# Patient Record
Sex: Female | Born: 1985 | Hispanic: No | Marital: Married | Smoking: Never smoker
Health system: Southern US, Community
[De-identification: ages and names within clinical notes are randomized; demographics above are authoritative.]

## PROBLEM LIST (undated history)

## (undated) DIAGNOSIS — O24419 Gestational diabetes mellitus in pregnancy, unspecified control: Secondary | ICD-10-CM

## (undated) DIAGNOSIS — O009 Unspecified ectopic pregnancy without intrauterine pregnancy: Secondary | ICD-10-CM

---

## 2018-05-09 LAB — OB RESULTS CONSOLE GC/CHLAMYDIA
Chlamydia: NEGATIVE
Gonorrhea: NEGATIVE

## 2018-05-09 LAB — OB RESULTS CONSOLE HIV ANTIBODY (ROUTINE TESTING): HIV: NONREACTIVE

## 2018-05-09 LAB — OB RESULTS CONSOLE HEPATITIS B SURFACE ANTIGEN: Hepatitis B Surface Ag: NEGATIVE

## 2018-05-09 LAB — OB RESULTS CONSOLE ANTIBODY SCREEN: ANTIBODY SCREEN: NEGATIVE

## 2018-05-09 LAB — OB RESULTS CONSOLE ABO/RH: RH TYPE: POSITIVE

## 2018-05-09 LAB — OB RESULTS CONSOLE RUBELLA ANTIBODY, IGM: Rubella: IMMUNE

## 2018-05-09 LAB — OB RESULTS CONSOLE RPR: RPR: NONREACTIVE

## 2018-05-13 ENCOUNTER — Other Ambulatory Visit: Payer: Self-pay

## 2018-05-13 ENCOUNTER — Encounter (HOSPITAL_BASED_OUTPATIENT_CLINIC_OR_DEPARTMENT_OTHER): Payer: Self-pay | Admitting: *Deleted

## 2018-05-13 ENCOUNTER — Emergency Department (HOSPITAL_BASED_OUTPATIENT_CLINIC_OR_DEPARTMENT_OTHER): Payer: PPO

## 2018-05-13 ENCOUNTER — Emergency Department (HOSPITAL_BASED_OUTPATIENT_CLINIC_OR_DEPARTMENT_OTHER)
Admission: EM | Admit: 2018-05-13 | Discharge: 2018-05-14 | Disposition: A | Discharge status: Home / Self Care | Payer: PPO | Attending: Emergency Medicine | Admitting: Emergency Medicine

## 2018-05-13 DIAGNOSIS — O200 Threatened abortion: Secondary | ICD-10-CM | POA: Insufficient documentation

## 2018-05-13 DIAGNOSIS — O209 Hemorrhage in early pregnancy, unspecified: Secondary | ICD-10-CM | POA: Diagnosis present

## 2018-05-13 DIAGNOSIS — N939 Abnormal uterine and vaginal bleeding, unspecified: Secondary | ICD-10-CM

## 2018-05-13 DIAGNOSIS — O468X1 Other antepartum hemorrhage, first trimester: Secondary | ICD-10-CM

## 2018-05-13 DIAGNOSIS — Z3A10 10 weeks gestation of pregnancy: Secondary | ICD-10-CM | POA: Insufficient documentation

## 2018-05-13 DIAGNOSIS — O418X10 Other specified disorders of amniotic fluid and membranes, first trimester, not applicable or unspecified: Secondary | ICD-10-CM

## 2018-05-13 LAB — CBC WITH DIFFERENTIAL/PLATELET
Basophils Absolute: 0 10*3/uL (ref 0.0–0.1)
Basophils Relative: 0 %
Eosinophils Absolute: 0.4 10*3/uL (ref 0.0–0.7)
Eosinophils Relative: 4 %
HCT: 34.8 % — ABNORMAL LOW (ref 36.0–46.0)
HEMOGLOBIN: 11.7 g/dL — AB (ref 12.0–15.0)
LYMPHS ABS: 2.9 10*3/uL (ref 0.7–4.0)
LYMPHS PCT: 29 %
MCH: 27.7 pg (ref 26.0–34.0)
MCHC: 33.6 g/dL (ref 30.0–36.0)
MCV: 82.3 fL (ref 78.0–100.0)
MONOS PCT: 10 %
Monocytes Absolute: 0.9 10*3/uL (ref 0.1–1.0)
NEUTROS ABS: 5.6 10*3/uL (ref 1.7–7.7)
NEUTROS PCT: 57 %
Platelets: 312 10*3/uL (ref 150–400)
RBC: 4.23 MIL/uL (ref 3.87–5.11)
RDW: 13.2 % (ref 11.5–15.5)
WBC: 9.8 10*3/uL (ref 4.0–10.5)

## 2018-05-13 LAB — BASIC METABOLIC PANEL
Anion gap: 8 (ref 5–15)
BUN: 8 mg/dL (ref 6–20)
CHLORIDE: 103 mmol/L (ref 98–111)
CO2: 25 mmol/L (ref 22–32)
CREATININE: 0.51 mg/dL (ref 0.44–1.00)
Calcium: 9 mg/dL (ref 8.9–10.3)
GFR calc non Af Amer: >60 mL/min (ref >60)
Glucose, Bld: 74 mg/dL (ref 70–99)
Potassium: 3.7 mmol/L (ref 3.5–5.1)
Sodium: 136 mmol/L (ref 135–145)

## 2018-05-13 LAB — ABO/RH: ABO/RH(D): O POS

## 2018-05-13 LAB — PREGNANCY, URINE: Preg Test, Ur: POSITIVE — AB

## 2018-05-13 LAB — HCG, QUANTITATIVE, PREGNANCY: hCG, Beta Chain, Quant, S: 176440 m[IU]/mL — ABNORMAL HIGH (ref <5)

## 2018-05-13 MED ORDER — ACETAMINOPHEN 500 MG PO TABS
1000.0000 mg | ORAL_TABLET | Freq: Once | ORAL | Status: AC
  Administered 2018-05-13: 1000 mg via ORAL
  Filled 2018-05-13: qty 2

## 2018-05-13 MED ORDER — SODIUM CHLORIDE 0.9 % IV BOLUS: 1000.0000 mL | Freq: Once | INTRAVENOUS | Status: DC

## 2018-05-13 NOTE — ED Triage Notes (Signed)
Pt states she is [redacted] weeks pregnant with twins. C/o vaginal bleeding "water with blood" x 30 mins

## 2018-05-13 NOTE — ED Notes (Signed)
Snacks given to family. Pt given pads for continued bleeding. No changes. Alert, NAD, calm.

## 2018-05-13 NOTE — ED Notes (Signed)
Remains in US.

## 2018-05-13 NOTE — ED Notes (Addendum)
Alert, NAD, calm, interactive, resps e/u, speaking in clear complete sentences, no dyspnea noted, skin W&D, VSS, pain worse, back and abd, 9/10, denies nausea. EDPA notified. [facility]

## 2018-05-13 NOTE — ED Notes (Addendum)
Alert, NAD, calm, interactive, resps e/u, speaking in clear complete sentences, no dyspnea noted, skin W&D, initial VSS, c/o mid lower abd pain and vaginal bleeding, (denies: other sx, including: other pain, urinary sx, sob, nausea, dizziness or visual changes), mentions "h/o ectopic and infertility tx", [facility]. Pt to US at this time.

## 2018-05-13 NOTE — ED Notes (Signed)
Pelvic in progress. VSS when lying supine. BP reading low when lying on L side.

## 2018-05-13 NOTE — ED Provider Notes (Signed)
[facility] EMERGENCY DEPARTMENT Provider Note   CSN: [id number] Arrival date & time: 05/13/18  2021     History   Chief Complaint Chief Complaint  Patient presents with  . Vaginal Bleeding    10 wk preg    HPI Cathy Bradford is a 32 y.o. female who is G3P1 who is currently [redacted] weeks pregnant with twins who presents for evaluation of vaginal bleeding that began 1-1/2 hours ago.  Husband reports that patient states that she felt like "her water broke and she had water mixed with blood."  He states that she did not have any sanitary napkins that she is just been using paper towels.  Cannot quantify how many she went through.  She denies any presence of clots.  Patient also reports she is having some lower abdominal cramping.  Husband reports that they had an ultrasound on 05/02/2018 that was unremarkable.  She has been following with [facility] for IVF treatments.  She is scheduled to meet with her OB/GYN in the next 2 weeks.  Patient does have a history of ectopic pregnancy but husband states that she did not have any of her fallopian tubes removed.  Patient denies any abdominal trauma, injury, fall.  She denies any fever, chest pain, difficulty breathing.   [facility]: [facility]  Fertility specialist: [facility]  The history is provided by the patient.    History reviewed. No pertinent past medical history.  There are no active problems to display for this patient.   History reviewed. No pertinent surgical history.   OB History    Gravida  1   Para      Term      Preterm      AB      Living        SAB      TAB      Ectopic      Multiple      Live Births               Home Medications    Prior to Admission medications   Not on File    Family History No family history on file.  Social History Social History   Tobacco Use  . Smoking status: Never Smoker  . Smokeless tobacco: Never Used  Substance Use Topics  . Alcohol use:  Never    Frequency: Never  . Drug use: Never     Allergies   Patient has no known allergies.   Review of Systems Review of Systems  Constitutional: Negative for fever.  Respiratory: Negative for cough and shortness of breath.   Cardiovascular: Negative for chest pain.  Gastrointestinal: Positive for abdominal pain. Negative for nausea and vomiting.  Genitourinary: Positive for vaginal bleeding. Negative for dysuria and hematuria.  Neurological: Negative for headaches.  All other systems reviewed and are negative.    Physical Exam Updated Vital Signs BP (!) 102/56 (BP Location: Left Arm)   Pulse 88   Temp 98.5 F (36.9 C) (Oral)   Resp 16   Ht 5\' 6"  (1.676 m)   Wt 81 kg (178 lb 9.2 oz)   SpO2 99%   BMI 28.82 kg/m   Physical Exam  Constitutional: She is oriented to person, place, and time. She appears well-developed and well-nourished.  Appears uncomfortable but no acute distress   HENT:  Head: Normocephalic and atraumatic.  Mouth/Throat: Oropharynx is clear and moist and mucous membranes are normal.  Eyes: Pupils are equal, round,  and reactive to light. Conjunctivae, EOM and lids are normal.  Neck: Full passive range of motion without pain.  Cardiovascular: Normal rate, regular rhythm, normal heart sounds and normal pulses. Exam reveals no gallop and no friction rub.  No murmur heard. Pulmonary/Chest: Effort normal and breath sounds normal.  Abdominal: Soft. Normal appearance. There is tenderness in the suprapubic area. There is no rigidity and no guarding.  Genitourinary: Uterus normal. Cervix exhibits no motion tenderness and no discharge. Right adnexum displays no mass and no tenderness. Left adnexum displays no mass and no tenderness. There is bleeding in the vagina.  Genitourinary Comments: The exam was performed with a chaperone present. Normal external female genitalia. No lesions, rash, or sores.  Bleeding noted in the vaginal vault.  No evidence of clots or  hemorrhage.  Cervical os appears slightly wider but does not appear open.  Musculoskeletal: Normal range of motion.  Neurological: She is alert and oriented to person, place, and time.  Skin: Skin is warm and dry. Capillary refill takes less than 2 seconds.  Psychiatric: She has a normal mood and affect. Her speech is normal.  Nursing note and vitals reviewed.    ED Treatments / Results  Labs (all labs ordered are listed, but only abnormal results are displayed) Labs Reviewed  CBC WITH DIFFERENTIAL/PLATELET - Abnormal; Notable for the following components:      Result Value   Hemoglobin 11.7 (*)    HCT 34.8 (*)    All other components within normal limits  PREGNANCY, URINE - Abnormal; Notable for the following components:   Preg Test, Ur POSITIVE (*)    All other components within normal limits  HCG, QUANTITATIVE, PREGNANCY - Abnormal; Notable for the following components:   hCG, Beta Chain, Quant, S 176,440 (*)    All other components within normal limits  BASIC METABOLIC PANEL  ABO/RH    EKG None  Radiology US Ob Comp < 14 Wks  Result Date: 05/13/2018 CLINICAL DATA:  32 year old pregnant female presenting with bloody vaginal discharge. EXAM: TWIN OBSTETRIC <14WK [place] AND TRANSVAGINAL OB US COMPARISON:  None. FINDINGS: Number of IUPs:  2 Chorionicity/Amnionicity:  Dichorionic diamniotic. TWIN 1 Yolk sac:  Seen Embryo:  Present Cardiac Activity: Detected Heart Rate: 175 bpm CRL:  26 mm   9 w 3 d                  [place] EDC: 12/13/2018 TWIN 2 Yolk sac:  Seen Embryo:  Present Cardiac Activity: Detected Heart Rate: 171 bpm CRL:  23 mm   8 w 6 d                  [place] EDC: 12/17/2018 Subchorionic hemorrhage: There is a 3.2 x 0.6 cm subchorionic hemorrhage in the fundal uterus with suboptimal evaluation for proximity to the gestational sacs. Maternal uterus/adnexae: The uterus is heterogeneous with hypoechoic areas which may represent fibroids. A 2.7 x 3.0 cm heterogeneous hypoechoic area in the  fundal uterus close to the second gestational sac may represent a fibroid and less likely a subchorionic hemorrhage. In addition the gestational sacs appear to be surrounded by a thick layer of endometrium and therefore the possibility of a septate or bicornuate uterine morphology is not entirely excluded. Further evaluation with repeat ultrasound on a non emergent/outpatient basis is recommended. The ovaries are not visualized. IMPRESSION: 1. Dichorionic diamniotic live twin pregnancy as described. 2. Heterogeneous uterus with probable fibroids and a moderate size subchorionic hemorrhage. Follow-up recommended. 3.  Suboptimal evaluation of the uterus on this exam. The possibility of a bicornuate or septate morphology is not entirely excluded. Further evaluation with repeat ultrasound on a nonemergent basis recommended. Electronically Signed   By: Elgie CollardArash  Radparvar M.D.   On: 05/13/2018 22:19   [place] Ob Comp Addl Gest Less 14 Wks  Result Date: 05/13/2018 CLINICAL DATA:  32 year old pregnant female presenting with bloody vaginal discharge. EXAM: TWIN OBSTETRIC <14WK US AND TRANSVAGINAL OB US COMPARISON:  None. FINDINGS: Number of IUPs:  2 Chorionicity/Amnionicity:  Dichorionic diamniotic. TWIN 1 Yolk sac:  Seen Embryo:  Present Cardiac Activity: Detected Heart Rate: 175 bpm CRL:  26 mm   9 w 3 d                  US EDC: 12/13/2018 TWIN 2 Yolk sac:  Seen Embryo:  Present Cardiac Activity: Detected Heart Rate: 171 bpm CRL:  23 mm   8 w 6 d                  US EDC: 12/17/2018 Subchorionic hemorrhage: There is a 3.2 x 0.6 cm subchorionic hemorrhage in the fundal uterus with suboptimal evaluation for proximity to the gestational sacs. Maternal uterus/adnexae: The uterus is heterogeneous with hypoechoic areas which may represent fibroids. A 2.7 x 3.0 cm heterogeneous hypoechoic area in the fundal uterus close to the second gestational sac may represent a fibroid and less likely a subchorionic hemorrhage. In addition the  gestational sacs appear to be surrounded by a thick layer of endometrium and therefore the possibility of a septate or bicornuate uterine morphology is not entirely excluded. Further evaluation with repeat ultrasound on a non emergent/outpatient basis is recommended. The ovaries are not visualized. IMPRESSION: 1. Dichorionic diamniotic live twin pregnancy as described. 2. Heterogeneous uterus with probable fibroids and a moderate size subchorionic hemorrhage. Follow-up recommended. 3. Suboptimal evaluation of the uterus on this exam. The possibility of a bicornuate or septate morphology is not entirely excluded. Further evaluation with repeat ultrasound on a nonemergent basis recommended. Electronically Signed   By: Elgie CollardArash  Radparvar M.D.   On: 05/13/2018 22:19   [place] Ob Transvaginal  Result Date: 05/13/2018 CLINICAL DATA:  32 year old pregnant female presenting with bloody vaginal discharge. EXAM: TWIN OBSTETRIC <14WK US AND TRANSVAGINAL OB US COMPARISON:  None. FINDINGS: Number of IUPs:  2 Chorionicity/Amnionicity:  Dichorionic diamniotic. TWIN 1 Yolk sac:  Seen Embryo:  Present Cardiac Activity: Detected Heart Rate: 175 bpm CRL:  26 mm   9 w 3 d                  US EDC: 12/13/2018 TWIN 2 Yolk sac:  Seen Embryo:  Present Cardiac Activity: Detected Heart Rate: 171 bpm CRL:  23 mm   8 w 6 d                  US EDC: 12/17/2018 Subchorionic hemorrhage: There is a 3.2 x 0.6 cm subchorionic hemorrhage in the fundal uterus with suboptimal evaluation for proximity to the gestational sacs. Maternal uterus/adnexae: The uterus is heterogeneous with hypoechoic areas which may represent fibroids. A 2.7 x 3.0 cm heterogeneous hypoechoic area in the fundal uterus close to the second gestational sac may represent a fibroid and less likely a subchorionic hemorrhage. In addition the gestational sacs appear to be surrounded by a thick layer of endometrium and therefore the possibility of a septate or bicornuate uterine morphology  is not entirely excluded. Further evaluation with repeat ultrasound on  a non emergent/outpatient basis is recommended. The ovaries are not visualized. IMPRESSION: 1. Dichorionic diamniotic live twin pregnancy as described. 2. Heterogeneous uterus with probable fibroids and a moderate size subchorionic hemorrhage. Follow-up recommended. 3. Suboptimal evaluation of the uterus on this exam. The possibility of a bicornuate or septate morphology is not entirely excluded. Further evaluation with repeat ultrasound on a nonemergent basis recommended. Electronically Signed   By: Elgie Collard M.D.   On: 05/13/2018 22:19    Procedures Procedures (including critical care time)  Medications Ordered in ED Medications  acetaminophen (TYLENOL) tablet 1,000 mg (1,000 mg Oral Given 05/13/18 2309)     Initial Impression / Assessment and Plan / ED Course  I have reviewed the triage vital signs and the nursing notes.  Pertinent labs & imaging results that were available during my care of the patient were reviewed by me and considered in my medical decision making (see chart for details).     32 y.o. F who is G3P1 who presents for evaluation of vaginal bleeding that began an hour and a half prior to ED arrival.  Patient is currently [redacted] weeks pregnant with twins.  She was seeing at [facility] for IVF treatments.  She was recently discharged from my care and was going to [facility] who she is scheduled to see in 3 days.  Has not yet seen them.  Comes in for vaginal bleeding that began today.  Is unable to quantify how many pads that she has been going through because she did not have any.  She has been using napkins.  No passing of clots.  Also reports some suprapubic abdominal pain.  No fevers, urinary complaints.  Consider and or completed AB.  Plan for ultrasound, labs.  Beta quant is 176, 440.  CBC shows no evidence of leukocytosis.  Hemoglobin is 11.7 and hematocrit is 34.8.  BMP is  unremarkable.  No previous CBCs for comparison.   Ultrasound reviewed.  Twin 1 has cardiac activity detected.  Estimated gestational age is 9 weeks and 3 days.  0.1 with detectable cardiac activity.  Estimated gestational age is 8 weeks and 6 days.  There is mention of a moderately sized subchorionic hemorrhage in the fundal uterus with suboptimal evaluation for proximity to the gestational sacs.  GU exam showed no evidence of vaginal hemorrhage.  She did have diffuse vaginal bleeding in the vaginal vault.  Cervical loss appears slightly wider than normal but is not completely open.  Given concerns, this could be a threatened AB.  Given history of IVF, will plan to consult [facility] who patient is scheduled to see on Tuesday for further assistance.  Paged [facility] but given that patient has not seen them before, she fell into an unassigned category. Discussed with Dr. Macon Large (OB/GYN) regarding workup here. Agrees that this could be threatened abortion. No other intervention needed here in the ED. Recommends d/c home with strict return precautions. Encouraged patient to follow up with [facility] as previously scheduled.   Updated patient and family on plan. They are in agreement. Vitals stable. Patient had ample opportunity for questions and discussion. All patient's questions were answered with full understanding. Strict return precautions discussed. Patient expresses understanding and agreement to plan.    Final Clinical Impressions(s) / ED Diagnoses   Final diagnoses:  Subchorionic hemorrhage of placenta in first trimester, single or unspecified fetus  Threatened abortion    ED Discharge Orders    None  Maxwell Caul, PA-C 05/14/18 1610    Vanetta Mulders, MD 05/14/18 1527

## 2018-05-14 NOTE — Discharge Instructions (Signed)
As we discussed, today your ultrasound revealed a subchorionic hemorrhage.  This could be the cause of the bleeding.  Additionally, this could be the beginning of a miscarriage.  [facility] as you have previously scheduled.  Return to the emergency department the [facility] if the bleeding becomes worse, if you start going through 3-4 pads an hour, you start passing clots, worsening pain, dizziness, lightheadedness, pass out or any other worsening or concerning symptoms.

## 2018-05-14 NOTE — ED Notes (Signed)
Pt and husband given d/c instructions as per chart. Verbalize understanding. No questions.

## 2018-05-16 ENCOUNTER — Other Ambulatory Visit (HOSPITAL_COMMUNITY)
Admission: RE | Admit: 2018-05-16 | Discharge: 2018-05-16 | Disposition: A | Discharge status: Home / Self Care | Payer: PPO | Source: Ambulatory Visit | Attending: Obstetrics and Gynecology | Admitting: Obstetrics and Gynecology

## 2018-05-16 ENCOUNTER — Other Ambulatory Visit: Payer: Self-pay | Admitting: Obstetrics and Gynecology

## 2018-05-16 DIAGNOSIS — Z01411 Encounter for gynecological examination (general) (routine) with abnormal findings: Secondary | ICD-10-CM | POA: Diagnosis present

## 2018-05-18 LAB — CYTOLOGY - PAP
Chlamydia: NEGATIVE
DIAGNOSIS: NEGATIVE
HPV (WINDOPATH): NOT DETECTED
NEISSERIA GONORRHEA: NEGATIVE

## 2018-06-07 ENCOUNTER — Other Ambulatory Visit (HOSPITAL_COMMUNITY): Payer: Self-pay | Admitting: Obstetrics and Gynecology

## 2018-06-07 DIAGNOSIS — O418X12 Other specified disorders of amniotic fluid and membranes, first trimester, fetus 2: Secondary | ICD-10-CM

## 2018-06-07 DIAGNOSIS — O468X1 Other antepartum hemorrhage, first trimester: Principal | ICD-10-CM

## 2018-06-07 DIAGNOSIS — IMO0001 Reserved for inherently not codable concepts without codable children: Secondary | ICD-10-CM

## 2018-06-12 ENCOUNTER — Ambulatory Visit (HOSPITAL_COMMUNITY)
Admission: RE | Admit: 2018-06-12 | Discharge: 2018-06-12 | Disposition: A | Discharge status: Home / Self Care | Payer: PPO | Source: Ambulatory Visit | Attending: Obstetrics and Gynecology | Admitting: Obstetrics and Gynecology

## 2018-06-12 ENCOUNTER — Ambulatory Visit (HOSPITAL_COMMUNITY): Payer: PPO

## 2018-06-12 DIAGNOSIS — O418X12 Other specified disorders of amniotic fluid and membranes, first trimester, fetus 2: Secondary | ICD-10-CM | POA: Diagnosis present

## 2018-06-12 DIAGNOSIS — O208 Other hemorrhage in early pregnancy: Secondary | ICD-10-CM | POA: Insufficient documentation

## 2018-06-12 DIAGNOSIS — Z3A13 13 weeks gestation of pregnancy: Secondary | ICD-10-CM | POA: Diagnosis not present

## 2018-06-12 DIAGNOSIS — O30041 Twin pregnancy, dichorionic/diamniotic, first trimester: Secondary | ICD-10-CM | POA: Diagnosis not present

## 2018-06-12 DIAGNOSIS — O468X1 Other antepartum hemorrhage, first trimester: Secondary | ICD-10-CM | POA: Diagnosis present

## 2018-06-13 ENCOUNTER — Ambulatory Visit (HOSPITAL_COMMUNITY): Payer: PPO

## 2018-08-23 IMAGING — US US OB EACH ADDL GEST<[ID]
1 series · 15 of 28 positions shown · non-contrast
Comparison: Prior ultrasound from 05/13/2018

CLINICAL DATA: Follow-up examination for twin gestation,
subchorionic hemorrhage.

EXAM:
TWIN OBSTETRIC <14WK US AND TRANSVAGINAL OB US

[Series 1: us ob each addl gest<(id) · 15 of 47 slices shown]
[im 1/47]
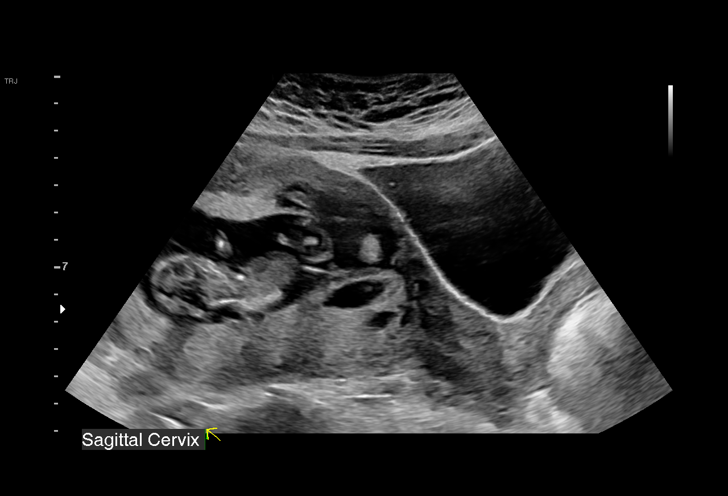
[im 4/47]
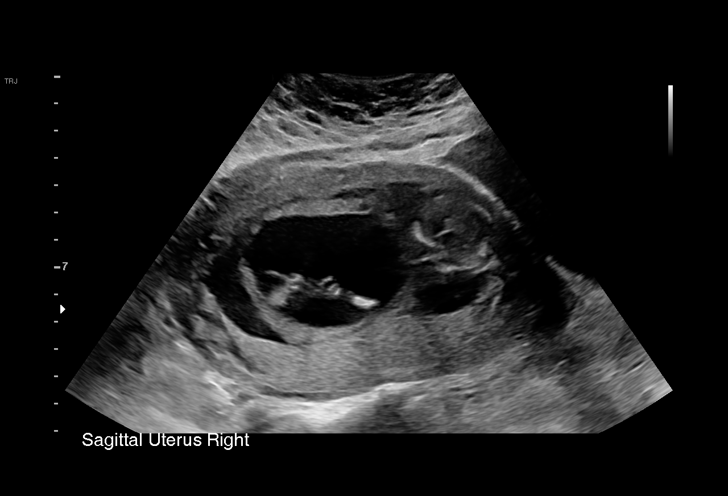
[im 7/47]
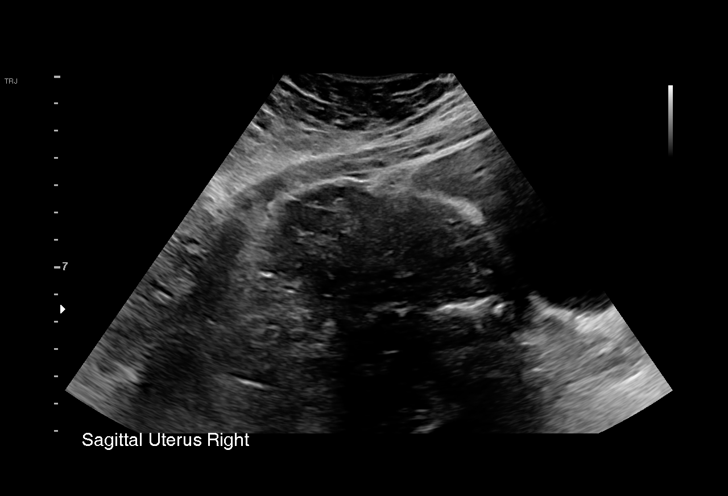
[im 11/47]
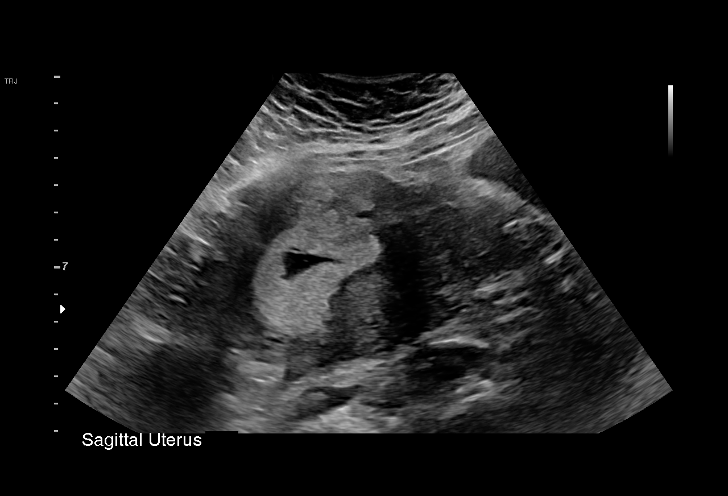
[im 14/47]
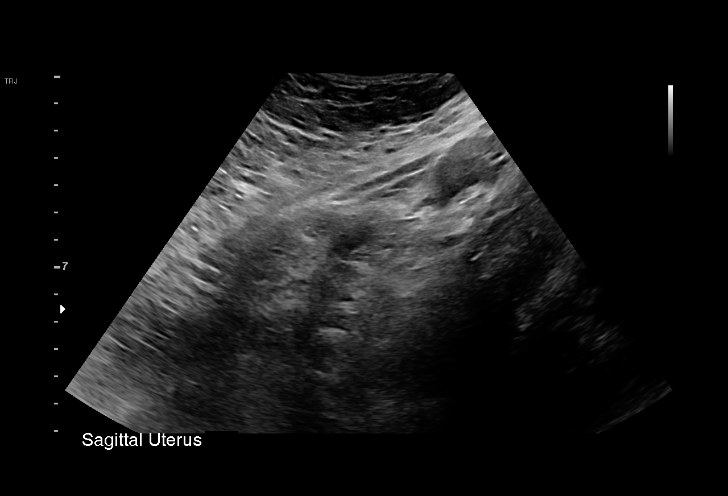
[im 18/47]
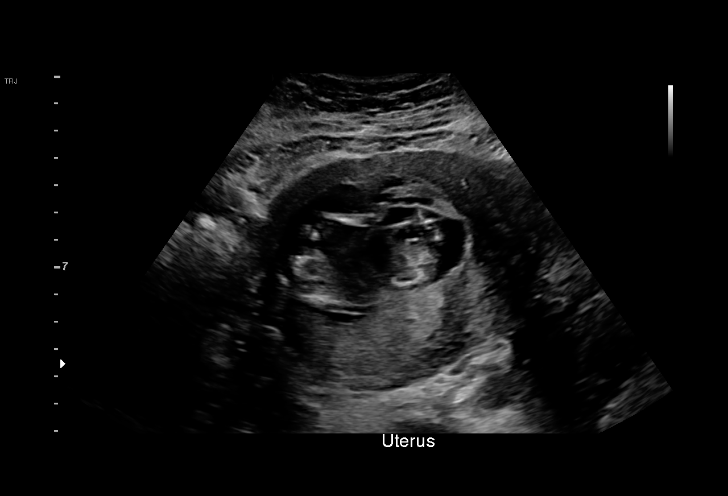
[im 21/47]
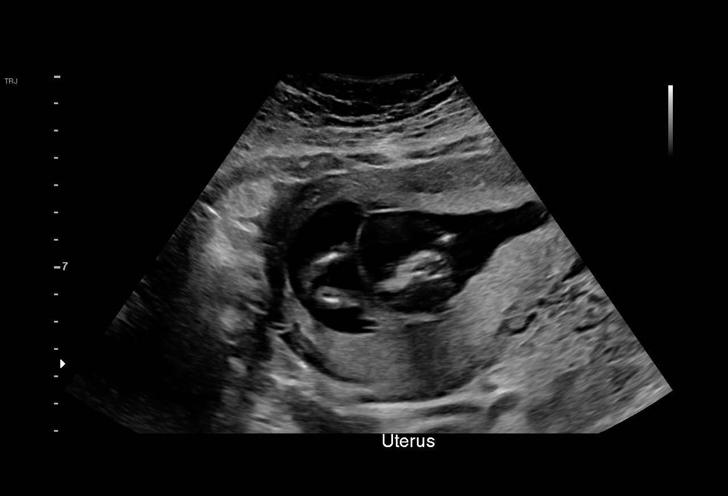
[im 24/47]
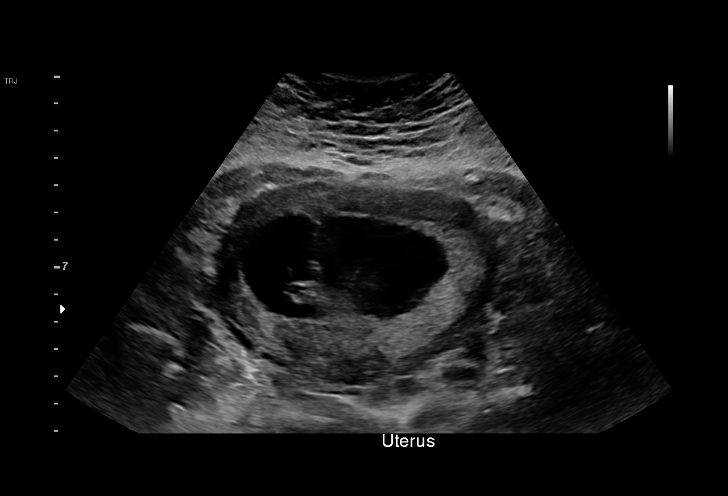
[im 26/47]
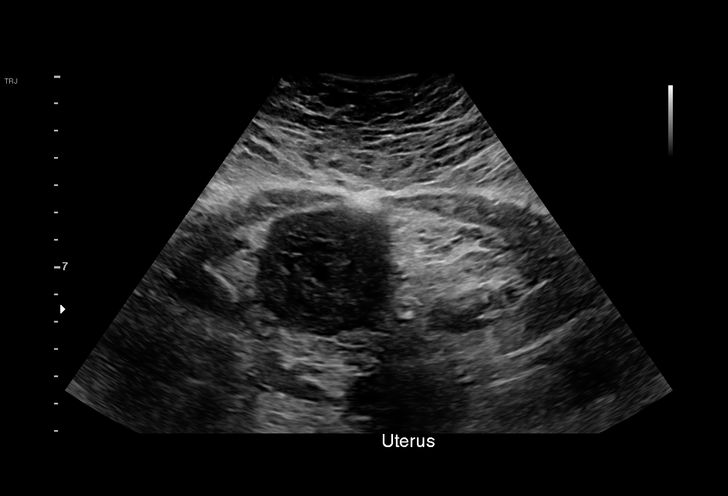
[im 29/47]
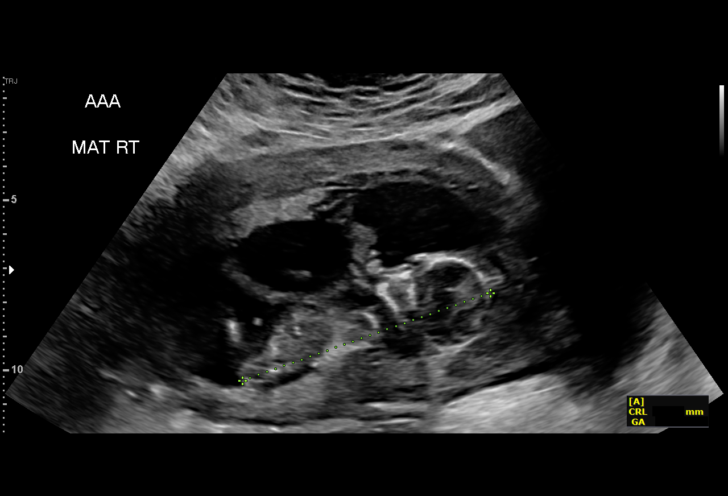
[im 33/47]
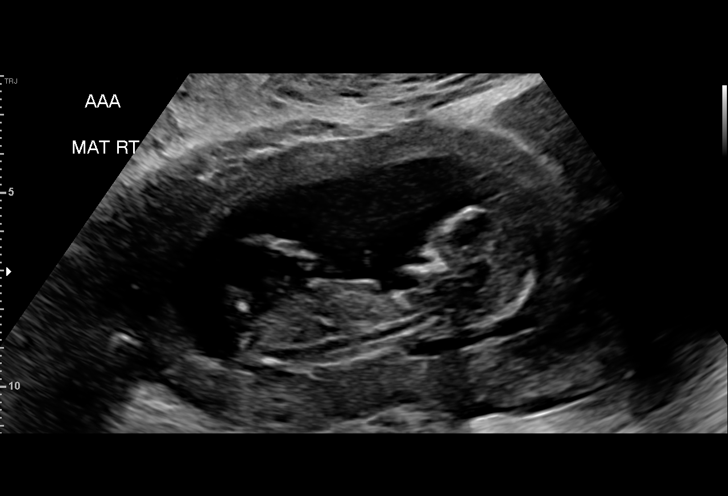
[im 36/47]
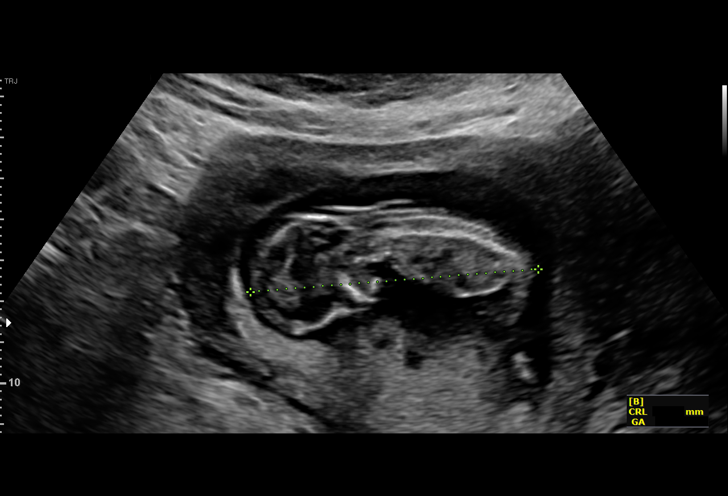
[im 40/47]
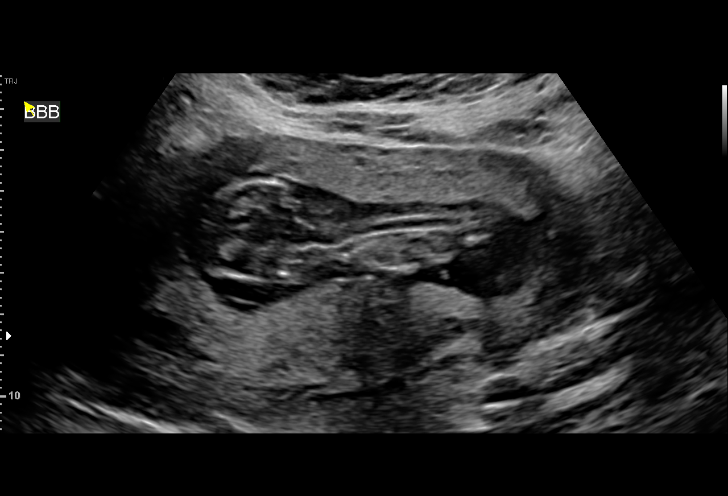
[im 43/47]
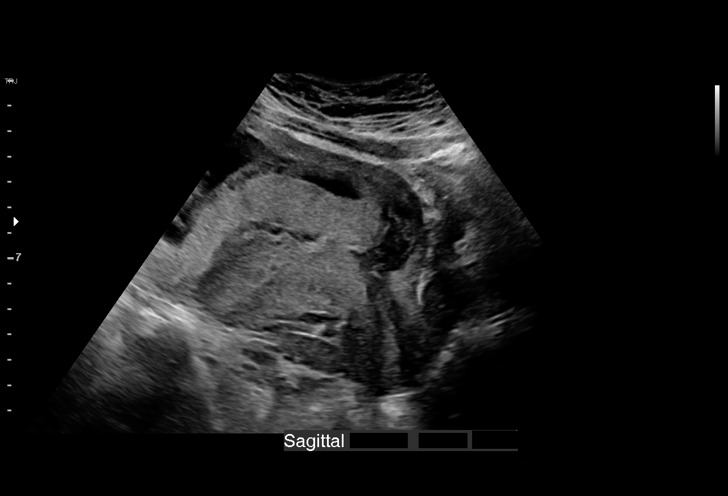
[im 47/47]
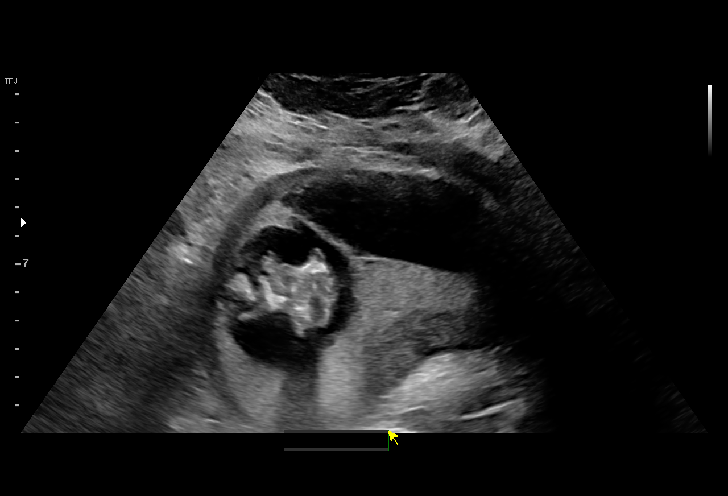

[15 of 28 positions shown; findings below may reference images not displayed]

FINDINGS: Number of IUPs:  2

Chorionicity/Amnionicity:  Dichorionic/diamniotic.

TWIN 1

Yolk sac:  Negative.

Embryo:  Present

Cardiac Activity: Present

Heart Rate: 154 bpm

CRL: 70.2 mm   13 w 6 d                  US EDC: 12/11/2018

TWIN 2

Yolk sac:  Negative.

Embryo:  Present

Cardiac Activity: Present

Heart Rate: 151 bpm

CRL: 71.2 mm   13 w 2 d                  US EDC: 12/16/2018

Subchorionic hemorrhage: Small subchorionic hemorrhage again seen,
measuring 3.2 x 1.7 x 0.9 cm. Hemorrhage extends from the cervical
os into the left lateral uterine body. No appreciable mass effect
(image 48).

Maternal uterus/adnexae: Ovaries are normal in appearance
bilaterally. No adnexal mass. No free fluid within the pelvis. Low
implantation of baby A placenta noted on postvoid images.
IMPRESSION: 1. Dichorionic diamniotic live twin pregnancy as described above.
2. 3.2 x 1.7 x 0.9 cm subchorionic hemorrhage without significant
mass effect.
3. Low implantation of baby A placenta on postvoid images. Continued
close interval follow-up recommended.

## 2018-10-09 ENCOUNTER — Encounter (HOSPITAL_COMMUNITY): Payer: Self-pay

## 2018-10-11 ENCOUNTER — Ambulatory Visit: Payer: PPO | Admitting: Registered"

## 2018-10-13 ENCOUNTER — Other Ambulatory Visit (HOSPITAL_COMMUNITY): Payer: Self-pay | Admitting: Obstetrics and Gynecology

## 2018-10-31 MED ORDER — METOCLOPRAMIDE HCL 5 MG/ML IJ SOLN
INTRAMUSCULAR | Status: AC
  Filled 2018-10-31: qty 2

## 2018-11-02 ENCOUNTER — Encounter (HOSPITAL_COMMUNITY): Payer: Self-pay

## 2018-11-07 ENCOUNTER — Encounter (HOSPITAL_COMMUNITY): Payer: Self-pay | Admitting: *Deleted

## 2018-11-09 ENCOUNTER — Other Ambulatory Visit (HOSPITAL_COMMUNITY): Payer: Self-pay | Admitting: Obstetrics and Gynecology

## 2018-11-09 ENCOUNTER — Ambulatory Visit (HOSPITAL_BASED_OUTPATIENT_CLINIC_OR_DEPARTMENT_OTHER)
Admission: RE | Admit: 2018-11-09 | Discharge: 2018-11-09 | Disposition: A | Discharge status: Home / Self Care | Payer: PPO | Source: Ambulatory Visit | Attending: Obstetrics and Gynecology | Admitting: Obstetrics and Gynecology

## 2018-11-09 ENCOUNTER — Ambulatory Visit (HOSPITAL_COMMUNITY)
Admission: RE | Admit: 2018-11-09 | Discharge: 2018-11-09 | Disposition: A | Discharge status: Home / Self Care | Payer: PPO | Source: Ambulatory Visit | Attending: Obstetrics and Gynecology | Admitting: Obstetrics and Gynecology

## 2018-11-09 ENCOUNTER — Encounter (HOSPITAL_COMMUNITY): Payer: Self-pay

## 2018-11-09 ENCOUNTER — Ambulatory Visit (HOSPITAL_COMMUNITY): Payer: PPO

## 2018-11-09 DIAGNOSIS — Z363 Encounter for antenatal screening for malformations: Secondary | ICD-10-CM

## 2018-11-09 DIAGNOSIS — O30043 Twin pregnancy, dichorionic/diamniotic, third trimester: Secondary | ICD-10-CM | POA: Diagnosis not present

## 2018-11-09 DIAGNOSIS — O24410 Gestational diabetes mellitus in pregnancy, diet controlled: Secondary | ICD-10-CM | POA: Diagnosis present

## 2018-11-09 DIAGNOSIS — O289 Unspecified abnormal findings on antenatal screening of mother: Secondary | ICD-10-CM

## 2018-11-09 DIAGNOSIS — O34219 Maternal care for unspecified type scar from previous cesarean delivery: Secondary | ICD-10-CM

## 2018-11-09 DIAGNOSIS — O365932 Maternal care for other known or suspected poor fetal growth, third trimester, fetus 2: Secondary | ICD-10-CM

## 2018-11-09 DIAGNOSIS — O365990 Maternal care for other known or suspected poor fetal growth, unspecified trimester, not applicable or unspecified: Secondary | ICD-10-CM

## 2018-11-09 DIAGNOSIS — O365931 Maternal care for other known or suspected poor fetal growth, third trimester, fetus 1: Secondary | ICD-10-CM

## 2018-11-09 DIAGNOSIS — Z3A34 34 weeks gestation of pregnancy: Secondary | ICD-10-CM

## 2018-11-09 HISTORY — DX: Unspecified ectopic pregnancy without intrauterine pregnancy: O00.90

## 2018-11-09 HISTORY — DX: Gestational diabetes mellitus in pregnancy, unspecified control: O24.419

## 2018-11-09 NOTE — Consult Note (Signed)
Maternal-Fetal Medicine  Name: Cathy Bradford MRN: [id number] Requesting Provider: Gerald Leitz, MD  Cathy Bradford, G3 P1011 at 35-weeks' gestation with dichorionic-diamniotic twin pregnancy, is here for ultrasound and consultation because of suspicion of fetal growth restriction in twins.   Patient conceived after IVF and embryo transfers (day 5). She has been having ultrasound at your office. On cell-free fetal DNA screening, the risks of fetal aneuploidies are not increased. Patient has gestational diabetes that is well-controlled on diet.  PMH: No history of hypertension or any other chronic medical conditions.  PSH: Cesarean section. Medications: Prenatal vitamins. Allergies: NKDA. Social: Denies tobacco or drug or alcohol use. She has been married 8 years and her husband is in good health. Gyn: No history of abnormal Pap smears. Husband has oligospermia (reason for IVF). Obstetric history is significant for a term cesarean delivery in 2014 ([place]) of a female infant weighing 5 lbs at birth. Patient reports she had low amniotic fluid and non-reassuring fetal heart trace during labor. In addition, she had one early spontaneous miscarriage.  On ultrasound, a dichorionic-diamniotic twin pregnancy is seen.  Twin A: Maternal right, cephalic, posterior placenta, female fetus. Amniotic fluid is normal and good fetal activity is seen. The estimated fetal weight is 1,801 grams (4 lbs) or at less than the 10th percentile. Fetal anatomical survey is limited because of advanced gestational age. Antenatal testing is reassuring. Umbilical artery Doppler showed normal diastolic flow. NST is reactive. BPP 10/10.  Twin B: Maternal left, cephalic, anterior placenta, female fetus. Amniotic fluid is normal and good fetal activity is seen. The estimated fetal weight is 1,905 grams (4 lbs) or at the 14th percentile. Fetal anatomical survey is limited because of advanced gestational age. Single umbilical artery is  seen. Antenatal testing is reassuring. Umbilical artery Doppler showed normal diastolic flow. NST is reactive. BPP 10/10.  Growth discordancy: 5% (normal).  I counseled the patient on the following:  Dichorionic-diamniotic twin pregnancy with isolated fetal growth restriction: I explained ultrasound findings. Ultrasound has limitations in accurately-estimating fetal weighs. Although her previous child weighed 5 lbs at birth, she had oligohydramnios and it is possible that there was fetal growth restriction. I also informed her that smaller babies do not reflect growth restriction (can be constitutionally small). Timing of delivery: Given the possibility of isolated fetal growth restriction, delivery is recommended between 36 0/7 to 37 6/7 weeks (ACOG Committee Opinion No. 764, Feb 2019). It is reasonable to consider delivery at 37 weeks. I counseled the couple to consider delivery at 37 weeks.  I did not counsel on gestational diabetes.   Previous cesarean delivery: We also discussed VBAC. I explained the risk of scar dehiscence (2% to 3% with oxytocin). If cervix is favorable, she can attempt VBAC. Alternatively, she can opt for elective cesarean delivery. Patient is keen on having more children. I informed her that repeat cesarean deliveries increase the likelihood of having placenta previa or accreta in subsequent pregnancies.  Recommendations: -Antenatal testing including Doppler studies next week. -Delivery at 37 weeks.  Discussed with Dr. Richardson Dopp. Patient has an appointment with her today.  Thank you for your consult. Please do not hesitate to contact me if you have any questions or concerns.  Consultation including face-to-face counseling: 40 min.

## 2018-11-09 NOTE — Procedures (Signed)
Cathy Bradford 10/25/1986 [id number]  Fetus A Non-Stress Test Interpretation for 11/09/18  Indication: Diabetes Cathy Bradford 05/14/1986 [id number]   Fetus B Non-Stress Test Interpretation for 11/09/18  Indication: Diabetes  Fetal Heart Rate Fetus B Mode: External Baseline Rate (B): 145 BPM Variability: Moderate Accelerations: 15 x 15 Decelerations: None  Uterine Activity Mode: Palpation, Toco Contraction Frequency (min): Occ. Contraction Duration (sec): 40-50 Contraction Quality: Mild Resting Tone Palpated: Relaxed Resting Time: Adequate  Interpretation (Baby B - Fetal Testing) Nonstress Test Interpretation (Baby B): Reactive Overall Impression (Baby B): Reassuring for gestational age Comments (Baby B): Reviewed tracing with Dr. Judeth CornfieldShankar    Fetal Heart Rate A Mode: External Baseline Rate (A): 135 bpm Variability: Moderate Accelerations: 15 x 15 Decelerations: None Multiple birth?: Yes  Uterine Activity Mode: Palpation, Toco Contraction Frequency (min): Occ. Contraction Duration (sec): 40-50 Contraction Quality: Mild Resting Tone Palpated: Relaxed Resting Time: Adequate  Interpretation (Fetal Testing) Nonstress Test Interpretation: Reactive Overall Impression: Reassuring for gestational age Comments: Reviewed tracing with Dr. Judeth CornfieldShankar

## 2018-11-15 ENCOUNTER — Encounter (HOSPITAL_COMMUNITY): Payer: Self-pay

## 2018-11-16 ENCOUNTER — Other Ambulatory Visit: Payer: Self-pay | Admitting: Obstetrics and Gynecology

## 2018-11-16 MED ORDER — BETAMETHASONE SOD PHOS & ACET 6 (3-3) MG/ML IJ SUSP: 12.0000 mg | INTRAMUSCULAR | Status: AC

## 2018-11-17 ENCOUNTER — Inpatient Hospital Stay (HOSPITAL_COMMUNITY)
Admission: AD | Admit: 2018-11-17 | Discharge: 2018-11-17 | Disposition: A | Discharge status: Home / Self Care | Payer: PPO | Attending: Obstetrics and Gynecology | Admitting: Obstetrics and Gynecology

## 2018-11-17 DIAGNOSIS — Z3A36 36 weeks gestation of pregnancy: Secondary | ICD-10-CM | POA: Insufficient documentation

## 2018-11-17 DIAGNOSIS — O365930 Maternal care for other known or suspected poor fetal growth, third trimester, not applicable or unspecified: Secondary | ICD-10-CM | POA: Diagnosis present

## 2018-11-17 MED ORDER — BETAMETHASONE SOD PHOS & ACET 6 (3-3) MG/ML IJ SUSP
12.0000 mg | Freq: Once | INTRAMUSCULAR | Status: AC
  Administered 2018-11-17: 12 mg via INTRAMUSCULAR
  Filled 2018-11-17: qty 2

## 2018-11-17 NOTE — MAU Note (Signed)
preg with twins,  GDM.  Verbalized understanding regarding injection.  Knows to return tomorrow.  No complaints.

## 2018-11-18 ENCOUNTER — Inpatient Hospital Stay (HOSPITAL_COMMUNITY)
Admission: AD | Admit: 2018-11-18 | Discharge: 2018-11-18 | Disposition: A | Discharge status: Home / Self Care | Payer: PPO | Source: Ambulatory Visit | Attending: Obstetrics & Gynecology | Admitting: Obstetrics & Gynecology

## 2018-11-18 DIAGNOSIS — Z3A36 36 weeks gestation of pregnancy: Secondary | ICD-10-CM | POA: Insufficient documentation

## 2018-11-18 DIAGNOSIS — O365930 Maternal care for other known or suspected poor fetal growth, third trimester, not applicable or unspecified: Secondary | ICD-10-CM | POA: Diagnosis present

## 2018-11-18 MED ORDER — BETAMETHASONE SOD PHOS & ACET 6 (3-3) MG/ML IJ SUSP
12.0000 mg | Freq: Once | INTRAMUSCULAR | Status: AC
  Administered 2018-11-18: 12 mg via INTRAMUSCULAR
  Filled 2018-11-18: qty 2

## 2018-11-18 NOTE — MAU Note (Signed)
Cathy Bradford is a 33 y.o. at [redacted]w[redacted]d here in MAU reporting: for 2nd BMZ injection. Denies any complaints today. Vitals:   11/18/18 1320  BP: 112/66  Pulse: 88  Resp: 17  Temp: 98.2 F (36.8 C)  SpO2: 97%     FHT: A-132 B-151 via doppler Lab orders placed from triage: sign and held BMZ

## 2018-11-22 ENCOUNTER — Other Ambulatory Visit: Payer: Self-pay | Admitting: Obstetrics and Gynecology

## 2018-11-22 ENCOUNTER — Encounter (HOSPITAL_COMMUNITY)
Admission: RE | Admit: 2018-11-22 | Discharge: 2018-11-22 | Disposition: A | Discharge status: Home / Self Care | Payer: PPO | Source: Ambulatory Visit | Attending: Obstetrics and Gynecology | Admitting: Obstetrics and Gynecology

## 2018-11-22 DIAGNOSIS — Z3493 Encounter for supervision of normal pregnancy, unspecified, third trimester: Secondary | ICD-10-CM

## 2018-11-22 LAB — CBC
HCT: 40.7 % (ref 36.0–46.0)
Hemoglobin: 13.5 g/dL (ref 12.0–15.0)
MCH: 28.4 pg (ref 26.0–34.0)
MCHC: 33.2 g/dL (ref 30.0–36.0)
MCV: 85.7 fL (ref 80.0–100.0)
PLATELETS: 146 10*3/uL — AB (ref 150–400)
RBC: 4.75 MIL/uL (ref 3.87–5.11)
RDW: 15 % (ref 11.5–15.5)
WBC: 8.1 10*3/uL (ref 4.0–10.5)
nRBC: 0 % (ref 0.0–0.2)

## 2018-11-22 LAB — ABO/RH: ABO/RH(D): O POS

## 2018-11-22 LAB — TYPE AND SCREEN
ABO/RH(D): O POS
Antibody Screen: NEGATIVE

## 2018-11-22 NOTE — Patient Instructions (Signed)
Cathy Bradford  11/22/2018   Your procedure is scheduled on:  11/23/2018  Enter through the Main Entrance of [facility] at 2:45 PM.  Pick up the phone at the desk and dial [phone]  Call this number if you have problems the morning of surgery:[phone]  Remember:   Do not eat food:(After Midnight) Desps de medianoche.  Do not drink clear liquids: (6 Hours before arrival) 6 horas ante llegada.  Take these medicines the morning of surgery with A SIP OF WATER: none   Do not wear jewelry, make-up or nail polish.  Do not wear lotions, powders, or perfumes. Do not wear deodorant.  Do not shave 48 hours prior to surgery.  Do not bring valuables to the hospital.  [facility] is not   responsible for any belongings or valuables brought to the hospital.  Contacts, dentures or bridgework may not be worn into surgery.  Leave suitcase in the car. After surgery it may be brought to your room.  For patients admitted to the hospital, checkout time is 11:00 AM the day of              discharge.    N/A   Please read over the following fact sheets that you were given:   Surgical Site Infection Prevention

## 2018-11-23 ENCOUNTER — Inpatient Hospital Stay (HOSPITAL_COMMUNITY): Payer: PPO | Admitting: Certified Registered Nurse Anesthetist

## 2018-11-23 ENCOUNTER — Encounter (HOSPITAL_COMMUNITY)
Admission: AD | Disposition: A | Discharge status: Home / Self Care | Payer: Self-pay | Source: Home / Self Care | Attending: Obstetrics and Gynecology

## 2018-11-23 ENCOUNTER — Other Ambulatory Visit: Payer: Self-pay

## 2018-11-23 ENCOUNTER — Inpatient Hospital Stay (HOSPITAL_COMMUNITY)
Admission: AD | Admit: 2018-11-23 | Discharge: 2018-11-26 | DRG: 788 | Disposition: A | Discharge status: Home / Self Care | Payer: PPO | Attending: Obstetrics and Gynecology | Admitting: Obstetrics and Gynecology

## 2018-11-23 ENCOUNTER — Encounter (HOSPITAL_COMMUNITY): Payer: Self-pay | Admitting: *Deleted

## 2018-11-23 DIAGNOSIS — O24420 Gestational diabetes mellitus in childbirth, diet controlled: Secondary | ICD-10-CM | POA: Diagnosis present

## 2018-11-23 DIAGNOSIS — O30043 Twin pregnancy, dichorionic/diamniotic, third trimester: Secondary | ICD-10-CM | POA: Diagnosis present

## 2018-11-23 DIAGNOSIS — O34211 Maternal care for low transverse scar from previous cesarean delivery: Principal | ICD-10-CM | POA: Diagnosis present

## 2018-11-23 DIAGNOSIS — O365931 Maternal care for other known or suspected poor fetal growth, third trimester, fetus 1: Secondary | ICD-10-CM | POA: Diagnosis present

## 2018-11-23 DIAGNOSIS — Z3A36 36 weeks gestation of pregnancy: Secondary | ICD-10-CM

## 2018-11-23 DIAGNOSIS — Z98891 History of uterine scar from previous surgery: Secondary | ICD-10-CM

## 2018-11-23 LAB — RPR, QUANT+TP ABS (REFLEX)
Rapid Plasma Reagin, Quant: 1:1 {titer} — ABNORMAL HIGH
T Pallidum Abs: NONREACTIVE

## 2018-11-23 LAB — CBC
HCT: 42.6 % (ref 36.0–46.0)
Hemoglobin: 14.2 g/dL (ref 12.0–15.0)
MCH: 28.5 pg (ref 26.0–34.0)
MCHC: 33.3 g/dL (ref 30.0–36.0)
MCV: 85.5 fL (ref 80.0–100.0)
NRBC: 0.8 % — AB (ref 0.0–0.2)
Platelets: 159 10*3/uL (ref 150–400)
RBC: 4.98 MIL/uL (ref 3.87–5.11)
RDW: 14.9 % (ref 11.5–15.5)
WBC: 7.9 10*3/uL (ref 4.0–10.5)

## 2018-11-23 LAB — RPR: RPR Ser Ql: REACTIVE — AB

## 2018-11-23 LAB — GLUCOSE, CAPILLARY: Glucose-Capillary: 53 mg/dL — ABNORMAL LOW (ref 70–99)

## 2018-11-23 SURGERY — Surgical Case
Anesthesia: Spinal | Wound class: Clean Contaminated

## 2018-11-23 MED ORDER — METHYLERGONOVINE MALEATE 0.2 MG/ML IJ SOLN
INTRAMUSCULAR | Status: DC | PRN
  Administered 2018-11-23: 0.2 mg via INTRAMUSCULAR

## 2018-11-23 MED ORDER — LACTATED RINGERS IV SOLN
INTRAVENOUS | Status: DC | PRN
  Administered 2018-11-23: 17:00:00 via INTRAVENOUS

## 2018-11-23 MED ORDER — NALBUPHINE HCL 10 MG/ML IJ SOLN: 5.0000 mg | INTRAMUSCULAR | Status: DC | PRN

## 2018-11-23 MED ORDER — EPHEDRINE 5 MG/ML INJ
INTRAVENOUS | Status: AC
  Filled 2018-11-23: qty 10

## 2018-11-23 MED ORDER — FERROUS SULFATE 325 (65 FE) MG PO TABS
325.0000 mg | ORAL_TABLET | Freq: Two times a day (BID) | ORAL | Status: DC
  Administered 2018-11-24 – 2018-11-26 (×5): 325 mg via ORAL
  Filled 2018-11-23 (×5): qty 1

## 2018-11-23 MED ORDER — METHYLERGONOVINE MALEATE 0.2 MG PO TABS: 0.2000 mg | ORAL_TABLET | ORAL | Status: DC | PRN

## 2018-11-23 MED ORDER — ACETAMINOPHEN 325 MG PO TABS
650.0000 mg | ORAL_TABLET | ORAL | Status: DC | PRN
  Administered 2018-11-23 – 2018-11-25 (×4): 650 mg via ORAL
  Filled 2018-11-23 (×4): qty 2

## 2018-11-23 MED ORDER — SIMETHICONE 80 MG PO CHEW: 80.0000 mg | CHEWABLE_TABLET | ORAL | Status: DC | PRN

## 2018-11-23 MED ORDER — SENNOSIDES-DOCUSATE SODIUM 8.6-50 MG PO TABS
2.0000 | ORAL_TABLET | ORAL | Status: DC
  Administered 2018-11-23 – 2018-11-25 (×3): 2 via ORAL
  Filled 2018-11-23 (×3): qty 2

## 2018-11-23 MED ORDER — ONDANSETRON HCL 4 MG/2ML IJ SOLN
INTRAMUSCULAR | Status: AC
  Filled 2018-11-23: qty 2

## 2018-11-23 MED ORDER — DEXAMETHASONE SODIUM PHOSPHATE 10 MG/ML IJ SOLN
INTRAMUSCULAR | Status: AC
  Filled 2018-11-23: qty 1

## 2018-11-23 MED ORDER — FENTANYL CITRATE (PF) 100 MCG/2ML IJ SOLN
INTRAMUSCULAR | Status: DC | PRN
  Administered 2018-11-23: 15 ug via INTRAVENOUS

## 2018-11-23 MED ORDER — LABETALOL HCL 5 MG/ML IV SOLN
INTRAVENOUS | Status: DC | PRN
  Administered 2018-11-23: 5 mg via INTRAVENOUS

## 2018-11-23 MED ORDER — ZOLPIDEM TARTRATE 5 MG PO TABS: 5.0000 mg | ORAL_TABLET | Freq: Every evening | ORAL | Status: DC | PRN

## 2018-11-23 MED ORDER — NALBUPHINE HCL 10 MG/ML IJ SOLN: 5.0000 mg | Freq: Once | INTRAMUSCULAR | Status: DC | PRN

## 2018-11-23 MED ORDER — PHENYLEPHRINE HCL 10 MG/ML IJ SOLN
INTRAMUSCULAR | Status: DC | PRN
  Administered 2018-11-23 (×3): 80 ug via INTRAVENOUS

## 2018-11-23 MED ORDER — IBUPROFEN 800 MG PO TABS
800.0000 mg | ORAL_TABLET | Freq: Three times a day (TID) | ORAL | Status: DC
  Administered 2018-11-24 – 2018-11-26 (×8): 800 mg via ORAL
  Filled 2018-11-23 (×8): qty 1

## 2018-11-23 MED ORDER — NALOXONE HCL 0.4 MG/ML IJ SOLN: 0.4000 mg | INTRAMUSCULAR | Status: DC | PRN

## 2018-11-23 MED ORDER — EPHEDRINE SULFATE 50 MG/ML IJ SOLN
INTRAMUSCULAR | Status: DC | PRN
  Administered 2018-11-23: 15 mg via INTRAVENOUS
  Administered 2018-11-23 (×2): 5 mg via INTRAVENOUS

## 2018-11-23 MED ORDER — KETOROLAC TROMETHAMINE 30 MG/ML IJ SOLN: 30.0000 mg | Freq: Four times a day (QID) | INTRAMUSCULAR | Status: AC | PRN

## 2018-11-23 MED ORDER — MORPHINE SULFATE (PF) 0.5 MG/ML IJ SOLN
INTRAMUSCULAR | Status: AC
  Filled 2018-11-23: qty 10

## 2018-11-23 MED ORDER — LACTATED RINGERS IV SOLN
INTRAVENOUS | Status: DC
  Administered 2018-11-23 (×3): via INTRAVENOUS

## 2018-11-23 MED ORDER — METOCLOPRAMIDE HCL 5 MG/ML IJ SOLN
INTRAMUSCULAR | Status: DC | PRN
  Administered 2018-11-23: 10 mg via INTRAVENOUS

## 2018-11-23 MED ORDER — PRENATAL MULTIVITAMIN CH
1.0000 | ORAL_TABLET | Freq: Every day | ORAL | Status: DC
  Administered 2018-11-24 – 2018-11-26 (×3): 1 via ORAL
  Filled 2018-11-23 (×3): qty 1

## 2018-11-23 MED ORDER — PHENYLEPHRINE 8 MG IN D5W 100 ML (0.08MG/ML) PREMIX OPTIME
INJECTION | INTRAVENOUS | Status: DC | PRN
  Administered 2018-11-23: 60 ug/min via INTRAVENOUS

## 2018-11-23 MED ORDER — DIBUCAINE 1 % RE OINT: 1.0000 "application " | TOPICAL_OINTMENT | RECTAL | Status: DC | PRN

## 2018-11-23 MED ORDER — METOCLOPRAMIDE HCL 5 MG/ML IJ SOLN: 10.0000 mg | Freq: Once | INTRAMUSCULAR | Status: DC | PRN

## 2018-11-23 MED ORDER — ACETAMINOPHEN 500 MG PO TABS
1000.0000 mg | ORAL_TABLET | Freq: Four times a day (QID) | ORAL | Status: AC
  Administered 2018-11-24 (×2): 1000 mg via ORAL
  Filled 2018-11-23 (×2): qty 2

## 2018-11-23 MED ORDER — OXYTOCIN 40 UNITS IN NORMAL SALINE INFUSION - SIMPLE MED: 2.5000 [IU]/h | INTRAVENOUS | Status: AC

## 2018-11-23 MED ORDER — LACTATED RINGERS IV SOLN: INTRAVENOUS | Status: DC

## 2018-11-23 MED ORDER — SODIUM CHLORIDE 0.9% FLUSH: 3.0000 mL | INTRAVENOUS | Status: DC | PRN

## 2018-11-23 MED ORDER — OXYTOCIN 10 UNIT/ML IJ SOLN
INTRAVENOUS | Status: DC | PRN
  Administered 2018-11-23: 40 [IU] via INTRAVENOUS

## 2018-11-23 MED ORDER — SIMETHICONE 80 MG PO CHEW
80.0000 mg | CHEWABLE_TABLET | Freq: Three times a day (TID) | ORAL | Status: DC
  Administered 2018-11-24 – 2018-11-26 (×8): 80 mg via ORAL
  Filled 2018-11-23 (×8): qty 1

## 2018-11-23 MED ORDER — DIPHENHYDRAMINE HCL 25 MG PO CAPS: 25.0000 mg | ORAL_CAPSULE | Freq: Four times a day (QID) | ORAL | Status: DC | PRN

## 2018-11-23 MED ORDER — WITCH HAZEL-GLYCERIN EX PADS: 1.0000 "application " | MEDICATED_PAD | CUTANEOUS | Status: DC | PRN

## 2018-11-23 MED ORDER — MEPERIDINE HCL 25 MG/ML IJ SOLN
INTRAMUSCULAR | Status: AC
  Filled 2018-11-23: qty 1

## 2018-11-23 MED ORDER — DEXAMETHASONE SODIUM PHOSPHATE 10 MG/ML IJ SOLN
INTRAMUSCULAR | Status: DC | PRN
  Administered 2018-11-23: 10 mg via INTRAVENOUS

## 2018-11-23 MED ORDER — OXYCODONE HCL 5 MG PO TABS
5.0000 mg | ORAL_TABLET | ORAL | Status: DC | PRN
  Administered 2018-11-25: 5 mg via ORAL
  Filled 2018-11-23: qty 1

## 2018-11-23 MED ORDER — MEPERIDINE HCL 25 MG/ML IJ SOLN: 6.2500 mg | INTRAMUSCULAR | Status: DC | PRN

## 2018-11-23 MED ORDER — SCOPOLAMINE 1 MG/3DAYS TD PT72: 1.0000 | MEDICATED_PATCH | Freq: Once | TRANSDERMAL | Status: DC

## 2018-11-23 MED ORDER — DIPHENHYDRAMINE HCL 25 MG PO CAPS: 25.0000 mg | ORAL_CAPSULE | ORAL | Status: DC | PRN

## 2018-11-23 MED ORDER — COCONUT OIL OIL: 1.0000 "application " | TOPICAL_OIL | Status: DC | PRN

## 2018-11-23 MED ORDER — DIPHENHYDRAMINE HCL 50 MG/ML IJ SOLN: 12.5000 mg | INTRAMUSCULAR | Status: DC | PRN

## 2018-11-23 MED ORDER — SIMETHICONE 80 MG PO CHEW
80.0000 mg | CHEWABLE_TABLET | ORAL | Status: DC
  Administered 2018-11-23 – 2018-11-25 (×3): 80 mg via ORAL
  Filled 2018-11-23 (×3): qty 1

## 2018-11-23 MED ORDER — KETOROLAC TROMETHAMINE 30 MG/ML IJ SOLN
INTRAMUSCULAR | Status: AC
  Filled 2018-11-23: qty 1

## 2018-11-23 MED ORDER — NALOXONE HCL 4 MG/10ML IJ SOLN: 1.0000 ug/kg/h | INTRAVENOUS | Status: DC | PRN

## 2018-11-23 MED ORDER — BUPIVACAINE IN DEXTROSE 0.75-8.25 % IT SOLN
INTRATHECAL | Status: DC | PRN
  Administered 2018-11-23: 1.6 mL via INTRATHECAL

## 2018-11-23 MED ORDER — CEFAZOLIN SODIUM-DEXTROSE 2-4 GM/100ML-% IV SOLN
2.0000 g | INTRAVENOUS | Status: AC
  Administered 2018-11-23: 2 g via INTRAVENOUS

## 2018-11-23 MED ORDER — FENTANYL CITRATE (PF) 100 MCG/2ML IJ SOLN
INTRAMUSCULAR | Status: AC
  Filled 2018-11-23: qty 2

## 2018-11-23 MED ORDER — METHYLERGONOVINE MALEATE 0.2 MG/ML IJ SOLN: 0.2000 mg | INTRAMUSCULAR | Status: DC | PRN

## 2018-11-23 MED ORDER — MENTHOL 3 MG MT LOZG: 1.0000 | LOZENGE | OROMUCOSAL | Status: DC | PRN

## 2018-11-23 MED ORDER — GLYCOPYRROLATE 0.2 MG/ML IJ SOLN
INTRAMUSCULAR | Status: DC | PRN
  Administered 2018-11-23: 0.2 mg via INTRAVENOUS

## 2018-11-23 MED ORDER — MEPERIDINE HCL 25 MG/ML IJ SOLN
INTRAMUSCULAR | Status: DC | PRN
  Administered 2018-11-23 (×2): 12.5 mg via INTRAVENOUS

## 2018-11-23 MED ORDER — MORPHINE SULFATE (PF) 0.5 MG/ML IJ SOLN
INTRAMUSCULAR | Status: DC | PRN
  Administered 2018-11-23: .15 mg via EPIDURAL

## 2018-11-23 MED ORDER — LACTATED RINGERS IV SOLN
INTRAVENOUS | Status: DC
  Administered 2018-11-24: 07:00:00 via INTRAVENOUS

## 2018-11-23 MED ORDER — KETOROLAC TROMETHAMINE 30 MG/ML IJ SOLN
30.0000 mg | Freq: Four times a day (QID) | INTRAMUSCULAR | Status: AC | PRN
  Administered 2018-11-23: 30 mg via INTRAMUSCULAR

## 2018-11-23 MED ORDER — ONDANSETRON HCL 4 MG/2ML IJ SOLN: 4.0000 mg | Freq: Three times a day (TID) | INTRAMUSCULAR | Status: DC | PRN

## 2018-11-23 MED ORDER — HYDROMORPHONE HCL 1 MG/ML IJ SOLN: 0.2000 mg | INTRAMUSCULAR | Status: DC | PRN

## 2018-11-23 MED ORDER — ONDANSETRON HCL 4 MG/2ML IJ SOLN
INTRAMUSCULAR | Status: DC | PRN
  Administered 2018-11-23: 4 mg via INTRAVENOUS

## 2018-11-23 MED ORDER — FENTANYL CITRATE (PF) 100 MCG/2ML IJ SOLN: 25.0000 ug | INTRAMUSCULAR | Status: DC | PRN

## 2018-11-23 SURGICAL SUPPLY — 35 items
BARRIER ADHS 3X4 INTERCEED (GAUZE/BANDAGES/DRESSINGS) ×3 IMPLANT
BENZOIN TINCTURE PRP APPL 2/3 (GAUZE/BANDAGES/DRESSINGS) ×3 IMPLANT
CHLORAPREP W/TINT 26ML (MISCELLANEOUS) ×3 IMPLANT
CLAMP CORD UMBIL (MISCELLANEOUS) IMPLANT
CLOSURE WOUND 1/2 X4 (GAUZE/BANDAGES/DRESSINGS) ×1
CLOTH BEACON ORANGE TIMEOUT ST (SAFETY) ×3 IMPLANT
DRSG OPSITE POSTOP 4X10 (GAUZE/BANDAGES/DRESSINGS) ×3 IMPLANT
ELECT REM PT RETURN 9FT ADLT (ELECTROSURGICAL) ×3
ELECTRODE REM PT RTRN 9FT ADLT (ELECTROSURGICAL) ×1 IMPLANT
EXTRACTOR VACUUM KIWI (MISCELLANEOUS) IMPLANT
GLOVE BIOGEL M 6.5 STRL (GLOVE) ×6 IMPLANT
GLOVE BIOGEL PI IND STRL 6.5 (GLOVE) ×1 IMPLANT
GLOVE BIOGEL PI IND STRL 7.0 (GLOVE) ×1 IMPLANT
GLOVE BIOGEL PI INDICATOR 6.5 (GLOVE) ×2
GLOVE BIOGEL PI INDICATOR 7.0 (GLOVE) ×2
GOWN STRL REUS W/TWL LRG LVL3 (GOWN DISPOSABLE) ×9 IMPLANT
KIT ABG SYR 3ML LUER SLIP (SYRINGE) IMPLANT
NEEDLE HYPO 25X5/8 SAFETYGLIDE (NEEDLE) IMPLANT
NS IRRIG 1000ML POUR BTL (IV SOLUTION) ×3 IMPLANT
PACK C SECTION WH (CUSTOM PROCEDURE TRAY) ×3 IMPLANT
PAD OB MATERNITY 4.3X12.25 (PERSONAL CARE ITEMS) ×3 IMPLANT
PENCIL SMOKE EVAC W/HOLSTER (ELECTROSURGICAL) ×3 IMPLANT
RTRCTR C-SECT PINK 25CM LRG (MISCELLANEOUS) IMPLANT
STRIP CLOSURE SKIN 1/2X4 (GAUZE/BANDAGES/DRESSINGS) ×2 IMPLANT
SUT PDS AB 0 CT1 27 (SUTURE) ×6 IMPLANT
SUT PLAIN 0 NONE (SUTURE) IMPLANT
SUT VIC AB 0 CTX 36 (SUTURE) ×6
SUT VIC AB 0 CTX36XBRD ANBCTRL (SUTURE) ×3 IMPLANT
SUT VIC AB 2-0 CT1 27 (SUTURE) ×2
SUT VIC AB 2-0 CT1 TAPERPNT 27 (SUTURE) ×1 IMPLANT
SUT VIC AB 3-0 SH 27 (SUTURE)
SUT VIC AB 3-0 SH 27X BRD (SUTURE) IMPLANT
SUT VIC AB 4-0 KS 27 (SUTURE) ×3 IMPLANT
TOWEL OR 17X24 6PK STRL BLUE (TOWEL DISPOSABLE) ×3 IMPLANT
TRAY FOLEY W/BAG SLVR 14FR LF (SET/KITS/TRAYS/PACK) ×3 IMPLANT

## 2018-11-23 NOTE — Anesthesia Procedure Notes (Signed)
Spinal  Patient location during procedure: OR Staffing Anesthesiologist: Montez Hageman, MD Performed: anesthesiologist  Preanesthetic Checklist Completed: patient identified, site marked, surgical consent, pre-op evaluation, timeout performed, IV checked, risks and benefits discussed and monitors and equipment checked Spinal Block Patient position: sitting Prep: DuraPrep Patient monitoring: heart rate, continuous pulse ox and blood pressure Approach: midline Location: L2-3 Injection technique: single-shot Needle Needle type: Sprotte  Needle gauge: 24 G Needle length: 9 cm Additional Notes Expiration date of kit checked and confirmed. Patient tolerated procedure well, without complications.

## 2018-11-23 NOTE — H&P (Signed)
Cathy Bradford is a 33 y.o. female  At 41 wks and 6 days with di/ di twin pregnancy and IUGR of twin A presenting for repeat cesarean section.  Pregnancy complicated by IUGR of twin A ( See MFM consult), history of cesarean section and gestational Diabetes diet controlled. Prenatal care provided by Dr Gerald Leitz with [facility]. Per Dr. Noralee Space delivery is recommended between 36 wks and 37 wks and 6 days.   . OB History    Gravida  3   Para  1   Term      Preterm  1   AB  1   Living  1     SAB      TAB      Ectopic  1   Multiple      Live Births             Past Medical History:  Diagnosis Date  . Ectopic pregnancy   . Gestational diabetes    Past Surgical History:  Procedure Laterality Date  . CESAREAN SECTION     Family History: family history includes Diabetes in her mother; Hypertension in her father and mother. Social History:  reports that she has never smoked. She has never used smokeless tobacco. She reports that she does not drink alcohol or use drugs.     Maternal Diabetes: Yes:  Diabetes Type:  Diet controlled Genetic Screening: Normal Maternal Ultrasounds/Referrals: Abnormal:  Findings:   IUGR Fetal Ultrasounds or other Referrals:  None Maternal Substance Abuse:  No Significant Maternal Medications:  None Significant Maternal Lab Results:  Lab values include: Other:  Other Comments:  None  Review of Systems  Constitutional: Negative.   HENT: Negative.   Eyes: Negative.   Respiratory: Negative.   Cardiovascular: Negative.   Gastrointestinal: Negative.   Genitourinary: Negative.   Musculoskeletal: Negative.   Skin: Negative.   Neurological: Negative.   Endo/Heme/Allergies: Negative.   Psychiatric/Behavioral: Negative.    History   Weight 82.1 kg, last menstrual period 03/08/2018. Exam Physical Exam  Vitals reviewed. Constitutional: She is oriented to person, place, and time. She appears well-developed and well-nourished.  HENT:   Head: Normocephalic and atraumatic.  Eyes: Pupils are equal, round, and reactive to light. Conjunctivae are normal.  Neck: Normal range of motion. Neck supple.  Cardiovascular: Normal rate and regular rhythm.  Respiratory: Effort normal and breath sounds normal.  GI: There is no abdominal tenderness.  Genitourinary:    Vagina normal.   Musculoskeletal: Normal range of motion.        General: No edema.  Neurological: She is alert and oriented to person, place, and time.  Skin: Skin is warm and dry.  Psychiatric: She has a normal mood and affect.    Prenatal labs: ABO, Rh: --/--/O POS Performed at [facility], [address], [place], [place] [address]  [phone]) Antibody: NEG (01/22 5945) Rubella: Immune (07/09 0000) RPR: Nonreactive (07/09 0000)  HBsAg: Negative (07/09 0000)  HIV: Non-reactive (07/09 0000)  GBS:     Assessment/Plan: 36 wks and 6 days with Di/Di twin pregnancy h/o cesarean section.  IUGR of twins A - delivery recommende. Pt desires repeat cesarean section. R/B/A of cesarean section discussed with the patient including but not limited to infection bleeding damage to bowel bladder baby and surrounding organs with the need for further surgery. Pt voiced understanding and desires to proceed.    Gerald Leitz 11/23/2018, 3:52 PM

## 2018-11-23 NOTE — Lactation Note (Addendum)
This note was copied from a baby's chart. Lactation Consultation Note  Patient Name: Cathy DowGirlB Yeila Croslin ONGEX'BToday's Date: 11/23/2018 Reason for consult: Initial assessment;Late-preterm 34-36.6wks;Infant < 6lbs;Maternal endocrine disorder;Other (Comment)(IVF) Type of Endocrine Disorder?: Diabetes  G2P3 Csection delivery of multiples from IVF pregnancy and mom with GDM. Mom reports the little girl bf right after delivery but little boy did not.  Assisted with positioning/latch boy.  He would not wake to latch. Dad fed him formula while assist with baby girl breastfeeding.  She would open and just hold nipple in mouth.  Left her STS with mom.  Baby Boy spitty and gaggy with formula bottle.  Attempt to feed him sidelying with same results.  Brought yellow nipples for dad to try at next feed . Reviewed LPT infant policy with parents. Left handouts.   Took pump and pump parts but mom wants to do some sts and then she will start pumping.  Urged to feed on cue and every 3 hours. Reviewed appropirate supplement amount with parents.  Reviewed yellow feeding log. Encouraged parents to keep one for each baby.Mom not feeling well right now she reports.  Still wanted to stay STS.  Urged parents to follow up with lactation as needed. Maternal Data Has patient been taught Hand Expression?: Yes Does the patient have breastfeeding experience prior to this delivery?: Yes  Feeding Feeding Type: Breast Fed Nipple Type: Slow - flow  LATCH Score Latch: Too sleepy or reluctant, no latch achieved, no sucking elicited.  Audible Swallowing: None  Type of Nipple: Everted at rest and after stimulation  Comfort (Breast/Nipple): Soft / non-tender  Hold (Positioning): Assistance needed to correctly position infant at breast and maintain latch.  LATCH Score: 5  Interventions Interventions: Assisted with latch;Skin to skin;Hand express;DEBP  Lactation Tools Discussed/Used     Consult Status Consult Status:  Follow-up Date: 11/24/18 Follow-up type: In-patient    [facility] Michaelle CopasS Aldred Mase 11/23/2018, 11:09 PM

## 2018-11-23 NOTE — Anesthesia Preprocedure Evaluation (Signed)
Anesthesia Evaluation  Patient identified by MRN, date of birth, ID band Patient awake    Reviewed: Allergy & Precautions, NPO status , Patient's Chart, lab work & pertinent test results  Airway Mallampati: II  TM Distance: >3 FB Neck ROM: Full    Dental no notable dental hx.    Pulmonary neg pulmonary ROS,    Pulmonary exam normal breath sounds clear to auscultation       Cardiovascular negative cardio ROS Normal cardiovascular exam Rhythm:Regular Rate:Normal     Neuro/Psych negative neurological ROS  negative psych ROS   GI/Hepatic negative GI ROS, Neg liver ROS,   Endo/Other  diabetes, Gestational  Renal/GU negative Renal ROS  negative genitourinary   Musculoskeletal negative musculoskeletal ROS (+)   Abdominal   Peds negative pediatric ROS (+)  Hematology negative hematology ROS (+)   Anesthesia Other Findings   Reproductive/Obstetrics (+) Pregnancy                             Anesthesia Physical Anesthesia Plan  ASA: II  Anesthesia Plan: Spinal   Post-op Pain Management:    Induction:   PONV Risk Score and Plan: 3 and Scopolamine patch - Pre-op, Ondansetron and Treatment may vary due to age or medical condition  Airway Management Planned: Natural Airway  Additional Equipment:   Intra-op Plan:   Post-operative Plan:   Informed Consent: I have reviewed the patients History and Physical, chart, labs and discussed the procedure including the risks, benefits and alternatives for the proposed anesthesia with the patient or authorized representative who has indicated his/her understanding and acceptance.     Dental advisory given  Plan Discussed with: CRNA  Anesthesia Plan Comments:         Anesthesia Quick Evaluation

## 2018-11-23 NOTE — Op Note (Signed)
Cesarean Section Procedure Note  Indications: previous uterine incision h/o cesarean section   Pre-operative Diagnosis: 36 week 6 day pregnancy.Di/Di twin pregnancy... IUGR Of twin A  Post-operative Diagnosis: same  Surgeon: Gerald Leitz M.D.  Assistants: Myna Hidalgo DO  Anesthesia: Spinal anesthesia  ASA Class: 2   Procedure Details   The patient was seen in the Holding Room. The risks, benefits, complications, treatment options, and expected outcomes were discussed with the patient.  The patient concurred with the proposed plan, giving informed consent.  The site of surgery properly noted/marked. The patient was taken to Operating Room # 9, identified as Cathy Bradford and the procedure verified as C-Section Delivery. A Time Out was held and the above information confirmed.  After induction of anesthesia, the patient was draped and prepped in the usual sterile manner. A Pfannenstiel incision was made and carried down through the subcutaneous tissue to the fascia. Fascial incision was made and extended transversely. The fascia was separated from the underlying rectus tissue superiorly and inferiorly. The peritoneum was identified and entered. Peritoneal incision was extended longitudinally. The utero-vesical peritoneal reflection was incised transversely and the bladder flap was bluntly freed from the lower uterine segment. A low transverse uterine incision was made. Delivered from cephalic presentation was a Female with Apgar scores of 7 at one minute and 8 at five minutes. After the umbilical cord was clamped and cut cord blood was obtained for evaluation. Twin B was delivered from cephalic presentation. This was a Female fetus with apgar scores of 8 at one minute and 9 at five minutes. The umbilical cord was clamped and cut cord blood was obtained for evaluation. The placenta was removed intact and appeared normal. The uterine outline, tubes and ovaries appeared normal. The uterine incision was  closed with running locked sutures of 0 vicryl . a second layer of 0 vicryl was used to imbricate the incision. Hemostasis was observed. Lavage was carried out until clear. The fascia was then reapproximated with running sutures of 0 pds. The skin was reapproximated with 4-0 vicryl.  Instrument, sponge, and needle counts were correct prior the abdominal closure and at the conclusion of the case.   Findings Female infant in cephalic presentation... female infant in cephalic presentation normal fallopian tubes and ovaries   Estimated Blood Loss:  less than 300 mL         Drains: None         Total IV Fluids:  Per anesthesia ml         Specimens: Placenta and Disposition:  Sent to Pathology          Implants: None         Complications:  None; patient tolerated the procedure well.         Disposition: PACU - hemodynamically stable.         Condition: stable  Attending Attestation: I performed the procedure.

## 2018-11-23 NOTE — Transfer of Care (Signed)
Immediate Anesthesia Transfer of Care Note  Patient: Cathy Bradford  Procedure(s) Performed: CESAREAN SECTION MULTI-GESTATIONAL (N/A )  Patient Location: PACU  Anesthesia Type:Spinal  Level of Consciousness: awake, alert  and oriented  Airway & Oxygen Therapy: Patient Spontanous Breathing  Post-op Assessment: Report given to RN and Post -op Vital signs reviewed and stable  Post vital signs: Reviewed and stable  Last Vitals:  Vitals Value Taken Time  BP    Temp    Pulse    Resp    SpO2      Last Pain:  Vitals:   11/23/18 1542  PainSc: 0-No pain         Complications: No apparent anesthesia complications

## 2018-11-24 ENCOUNTER — Encounter (HOSPITAL_COMMUNITY): Payer: Self-pay | Admitting: Obstetrics and Gynecology

## 2018-11-24 LAB — CBC
HCT: 33.7 % — ABNORMAL LOW (ref 36.0–46.0)
Hemoglobin: 11 g/dL — ABNORMAL LOW (ref 12.0–15.0)
MCH: 27.6 pg (ref 26.0–34.0)
MCHC: 32.6 g/dL (ref 30.0–36.0)
MCV: 84.7 fL (ref 80.0–100.0)
PLATELETS: 130 10*3/uL — AB (ref 150–400)
RBC: 3.98 MIL/uL (ref 3.87–5.11)
RDW: 14.7 % (ref 11.5–15.5)
WBC: 14.2 10*3/uL — ABNORMAL HIGH (ref 4.0–10.5)
nRBC: 0.5 % — ABNORMAL HIGH (ref 0.0–0.2)

## 2018-11-24 LAB — GLUCOSE, CAPILLARY: Glucose-Capillary: 129 mg/dL — ABNORMAL HIGH (ref 70–99)

## 2018-11-24 NOTE — Progress Notes (Addendum)
Patient states that pain is 8. Ibuprofen given indue to faces pain score.

## 2018-11-24 NOTE — Progress Notes (Signed)
Subjective: Postop Day 1: Cesarean Delivery No complaints.  Pain controlled.  Lochia normal.  Breast feeding yes.  Pt felt dizzy upon standing overnight.  Bleeding not heavy.  Objective: Temp:  [97.6 F (36.4 C)-99 F (37.2 C)] 99 F (37.2 C) (01/24 0615) Pulse Rate:  [68-108] 68 (01/24 0615) Resp:  [15-23] 16 (01/24 0615) BP: (98-137)/(61-107) 98/61 (01/24 0615) SpO2:  [96 %-100 %] 96 % (01/24 0615) Weight:  [82.1 kg] 82.1 kg (01/23 1510)  Physical Exam: Gen: NAD Lochia: Not visualized Uterine Fundus: firm, appropriately tender Incision: clean, dry and intact, healing well DVT Evaluation: + Edema present, no calf tenderness bilaterally   Recent Labs    11/23/18 1544 11/24/18 0531  HGB 14.2 11.0*  HCT 42.6 33.7*    Assessment/Plan: Status post C-section-doing well postoperatively. Twin gestation-Babies in room with parents. Dizziness-Hg not severely low, VSS.  Pt instructed to tell RN if it persists. Routine post op care. Encouraged ambulation in halls TID when dizziness resolves. Lactation support.   Geryl Rankins 11/24/2018, 7:08 AM

## 2018-11-24 NOTE — Progress Notes (Signed)
Tylenol given for faces pain score.

## 2018-11-24 NOTE — Progress Notes (Addendum)
Patient states no improvement with ibuprofen for neck pain. Heat apllied with ambulation recommendation due to faces pain score.

## 2018-11-24 NOTE — Lactation Note (Signed)
This note was copied from a baby's chart. Lactation Consultation Note  Patient Name: Cathy Bradford [id number] Date: 11/24/2018 Reason for consult: Follow-up assessment;Multiple gestation;Infant < 6lbs;Late-preterm 34-36.6wks Babies are 46 hours old.  Cathy is latching at times but girl is not interested.  Both babies are getting formula supplements.  Symphony pump set up and instructions given.  Mom will attempt the breast with feeding cues but at least every 3 hours and post pump x 15 minutes.  Babies will receive expressed milk/formula with slow flow nipple.  Encouraged to call for assist prn.  Maternal Data    Feeding Feeding Type: Breast Fed  LATCH Score                   Interventions    Lactation Tools Discussed/Used Pump Review: Setup, frequency, and cleaning;Milk Storage Initiated by:: LM Date initiated:: 11/24/18   Consult Status Consult Status: Follow-up Date: 11/25/18 Follow-up type: In-patient    Huston Foley 11/24/2018, 10:53 AM

## 2018-11-24 NOTE — Anesthesia Postprocedure Evaluation (Signed)
Anesthesia Post Note  Patient: Cathy Bradford  Procedure(s) Performed: CESAREAN SECTION MULTI-GESTATIONAL (N/A )     Patient location during evaluation: Mother Baby Anesthesia Type: Spinal Level of consciousness: awake Pain management: pain level controlled Vital Signs Assessment: post-procedure vital signs reviewed and stable Respiratory status: spontaneous breathing Cardiovascular status: stable Postop Assessment: patient able to bend at knees and spinal receding Anesthetic complications: no    Last Vitals:  Vitals:   11/24/18 0220 11/24/18 0615  BP: 112/64 98/61  Pulse: 93 68  Resp: 18 16  Temp: 37 C 37.2 C  SpO2: 97% 96%    Last Pain:  Vitals:   11/24/18 0720  TempSrc:   PainSc: 0-No pain   Pain Goal:                   Edison Pace

## 2018-11-24 NOTE — Progress Notes (Signed)
Attempt to report UOP

## 2018-11-25 NOTE — Progress Notes (Signed)
Pt rates pain 9/10 in neck and shoulder, patient's husband states she is very anxious about "everything" and especially the twins' weight and feedings. Pt states neck and shoulder pain is better when she ambulates. Encouraged to walk in hallway, then take a shower. Patient followed instructions. Kpad also given for after shower. Also medicated with Tylenol and Motrin per pt request. She wants to try to rest and take a nap, if pain is no better, she would like something stronger for pain. Husband at bedside and is very supportive.

## 2018-11-25 NOTE — Progress Notes (Signed)
Subjective: Postop Day 2: Cesarean Delivery No complaints.  Pain controlled.  Lochia normal.  Breast feeding yes.   Ambulated in halls. FOB states he needs to speak with his mother regarding circumcision.  They may do outpatient circumcision at 21 days due to religious purposes.  Objective: Temp:  [97.6 F (36.4 C)-98.9 F (37.2 C)] 98.3 F (36.8 C) (01/25 0510) Pulse Rate:  [75-97] 97 (01/25 0510) Resp:  [18] 18 (01/25 0510) BP: (106-122)/(69-86) 118/69 (01/25 0510) SpO2:  [96 %-99 %] 99 % (01/25 0510) Weight:  [84.6 kg] 84.6 kg (01/25 0510)  Physical Exam: Gen: NAD Lochia: Not visualized Uterine Fundus: firm, appropriately tender Incision: clean, dry and intact, healing well DVT Evaluation: + Edema present, no calf tenderness bilaterally   Recent Labs    11/23/18 1544 11/24/18 0531  HGB 14.2 11.0*  HCT 42.6 33.7*    Assessment/Plan: Status post C-section-doing well postoperatively. Twin gestation-Babies in room with parents.   Possible circumcision prior to discharge. Dizziness yesterday due to dehydration. Continue Routine post op care. Encouraged ambulation in halls. Lactation support. Anticipate discharge tomorrow.   Geryl Rankins 11/25/2018, 8:45 AM

## 2018-11-25 NOTE — Lactation Note (Addendum)
This note was copied from a baby's chart. Lactation Consultation Note  Patient Name: Cathy Isidoro DonningReem Poet Bradford Bradford: 11/25/2018 Reason for consult: Follow-up assessment;Late-preterm 34-36.6wks;Infant < 6lbs   Twins 2543 hours old.  Mother states she has been breastfeeding and supplementing after w/ formula. She states breastfeeding sessions are lasting approx 5 min. Discussed LPI feeding behavior.   Mother states she has tried pumping once but wanted a review. Reviewed hand expression first w/ drops expressed bilaterally. Assisted w/ DEBP and breast massage/compression during pumping session. Answered questions.  Reviewed volume guidelines and feeding q 3 hours. Mother has DEBP at home. Mother will call today for assistance w/ latching but babies have been sleepy.  Plan: Mother will breastfeed first if infants are able, then supplement after with any volume pumped and the difference w/ formula and then pump if able after every other feeding.     Maternal Data Has patient been taught Hand Expression?: Yes  Feeding Feeding Type: Breast Fed Nipple Type: Slow - flow  LATCH Score                   Interventions Interventions: DEBP;Hand express  Lactation Tools Discussed/Used     Consult Status Consult Status: Follow-up Bradford: 11/26/18 Follow-up type: In-patient    Cathy Bradford, Cathy Bradford 11/25/2018, 12:41 PM

## 2018-11-26 MED ORDER — IBUPROFEN 600 MG PO TABS: 600.0000 mg | ORAL_TABLET | Freq: Four times a day (QID) | ORAL | 1 refills | Status: AC

## 2018-11-26 MED ORDER — OXYCODONE HCL 5 MG PO TABS: 5.0000 mg | ORAL_TABLET | ORAL | 0 refills | Status: AC | PRN

## 2018-11-26 NOTE — Discharge Summary (Signed)
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 yo G3P0213 at [redacted]w[redacted]d was admitted to the hospital 11/23/2018 for scheduled cesarean section with the following indication:IUGR of Baby Boy.  Membrane Rupture Time/Date:    Melanye, Hilligoss [[id number]]  4:59 PM   Nani, Duncan Boy Ryenne [[id number]]  4:58 PM ,   Tessalyn, Dest Elese [[id number]]  11/23/2018   Isaac, Boy Alliyah [[id number]]  11/23/2018   Patient delivered a Viable infant.   Milen, Andres [[id number]]  11/23/2018   Kylynn, Onufer Hildreth [[id number]]  11/23/2018  Details of operation can be found in separate operative note.  Pateint had an uncomplicated postpartum course.  She is ambulating, tolerating a regular diet, passing flatus, and urinating well. Patient is discharged home in stable condition on  11/26/18         Physical exam  Vitals:   11/25/18 0510 11/25/18 1448 11/25/18 2130 11/26/18 0605  BP: 118/69 129/80 126/85 114/70  Pulse: 97 94 92 77  Resp: 18 19 18 18   Temp: 98.3 F (36.8 C) 98 F (36.7 C) 98.2 F (36.8 C) 98 F (36.7 C)  TempSrc: Oral Oral  Axillary  SpO2: 99% 96% 98% 98%  Weight: 84.6 kg     Height: 5\' 6"   (1.676 m)      General: alert, cooperative and no distress Lochia: appropriate Uterine Fundus: firm Incision: Dressing is clean, dry, and intact DVT Evaluation: No evidence of DVT seen on physical exam. Calf/Ankle edema is present Labs: Lab Results  Component Value Date   WBC 14.2 (H) 11/24/2018   HGB 11.0 (L) 11/24/2018   HCT 33.7 (L) 11/24/2018   MCV 84.7 11/24/2018   PLT 130 (L) 11/24/2018   CMP Latest Ref Rng & Units 05/13/2018  Glucose 70 - 99 mg/dL 74  BUN 6 - 20 mg/dL 8  Creatinine 1.03 - 1.59 mg/dL 4.58  Sodium 592 - 924 mmol/L 136  Potassium 3.5 - 5.1 mmol/L 3.7  Chloride 98 - 111 mmol/L 103  CO2 22 - 32 mmol/L 25  Calcium 8.9 - 10.3 mg/dL 9.0    Discharge instruction: per After Visit Summary and "Baby and Me Booklet".  After visit meds:  Allergies as of 11/26/2018   No Known Allergies     Medication List  TAKE these medications   CALCIUM PO Take 1 tablet by mouth daily.   ibuprofen 600 MG tablet Commonly known as:  ADVIL,MOTRIN Take 1 tablet (600 mg total) by mouth every 6 (six) hours.   oxyCODONE 5 MG immediate release tablet Commonly known as:  Oxy IR/ROXICODONE Take 1 tablet (5 mg total) by mouth every 4 (four) hours as needed for moderate pain.   PRENATAL VITAMIN PO Take 1 tablet by mouth daily.       Diet: routine diet  Activity: Advance as tolerated. Pelvic rest for 6 weeks.   Outpatient follow up:2 weeks Follow up Appt:No future appointments. Follow up Visit:No follow-ups on file.  Postpartum contraception: Discussed  Newborn Data:   Constancia, Karcher [[id number]]  Live born female  Birth Weight: 4 lb 5.5 oz (1970 g) APGAR: 8, 9  Newborn Delivery   Birth date/time:  11/23/2018 16:59:00 Delivery type:  C-Section, Low Transverse Trial of labor:  No C-section categorization:  Repeat      Heuberger, Boy Kassondra [[id number]]  Live born female  Birth Weight: 4 lb 12.7 oz (2175 g) APGAR: 7, 8  Newborn Delivery   Birth date/time:   11/23/2018 16:58:00 Delivery type:  C-Section, Low Transverse Trial of labor:  No C-section categorization:  Repeat     Baby Feeding: Bottle and Breast Disposition:home with mother  Circumcision done on Baby Boy POD #2.  11/26/2018 Geryl Rankins, MD

## 2018-11-26 NOTE — Discharge Instructions (Addendum)

## 2018-11-26 NOTE — Lactation Note (Addendum)
This note was copied from a baby's chart. Lactation Consultation Note  Patient Name: Cathy Bradford [id number] Date: 11/26/2018   Twins 2 hours old.  Twins have had minimal weight loss in the last 24 hours. This morning Baby girl has had difficulty taking volume and keeping it down per Evangeline Gula and FOB.  FOB states he has tried both green and yellow nipples and finger syringe feeding. The last breastfeeding session was last night at midnight for a few minutes. She took 21 ml of formula after breastfeeding brief session but vomited most volume after. She also vomited after the most recent bottle feedings per RN in which baby took 7, 10 & 5 ml.  Family knows to hold baby upright after feedings.   Discussed breastfeeding may be taking too much energy and the importance of pumping instead. Mother sleeping.  FOB will remind mother to pump when she wakes and to try to pump q 3 hours. Suggest between feedings that family allow baby girl to sleep with hat on swaddled.  Dim lights and reduce stimulation.   FOB states Baby boy is doing well and volumes reflect that.     Mother was able to pump 42 ml of breastmilk. Baby girl took 17 ml and Baby boy took 25 ml of breastmilk and an additional 10 ml of formula. Encouraged mother to pump q 3 hours if she is able. Reviewed feeding volume increasing per day of life and as babies desire. Discussed slow flow nipples for feeding infants. Reviewed engorgement care and monitoring voids/stools. Recommend OP appt and family has phone number to call if they choose to schedule.     Maternal Data    Feeding Feeding Type: Formula Nipple Type: Slow - flow  LATCH Score                   Interventions    Lactation Tools Discussed/Used     Consult Status      Hardie Pulley 11/26/2018, 9:10 AM

## 2019-01-20 IMAGING — US US MFM FETAL BPP W/ NONSTRESS ADDL GEST
1 series · 11 of 28 positions shown · non-contrast
Comparison: none

[Series 1: us mfm fetal bpp w/ nonstress addl gest · 137 acquisitions, 11 frames shown]
[im 6/137]
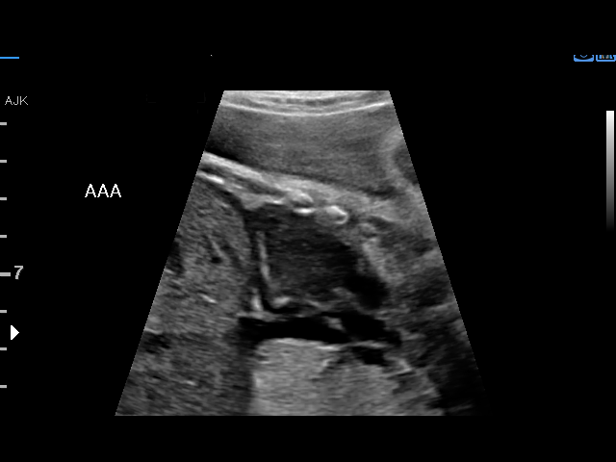
[im 16/137]
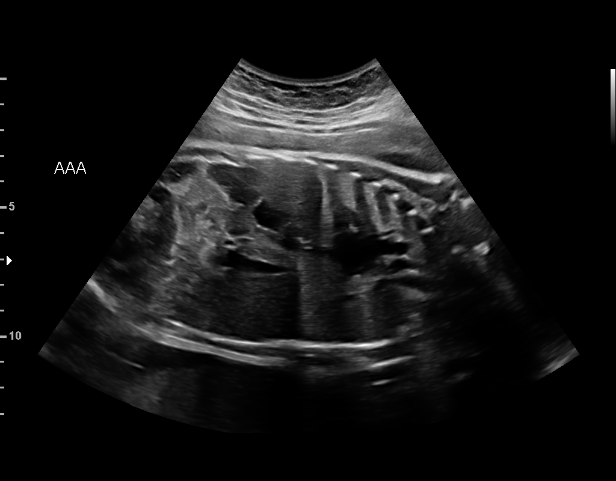
[im 31/137]
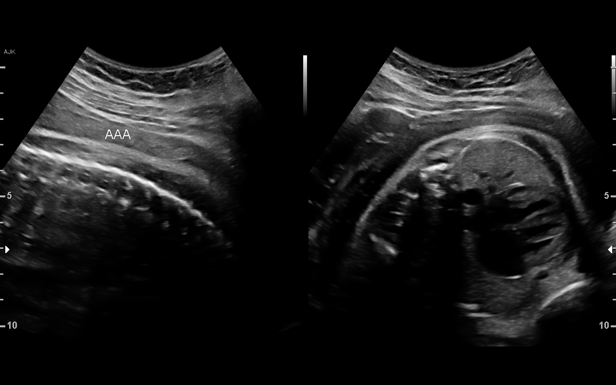
[im 41/137]
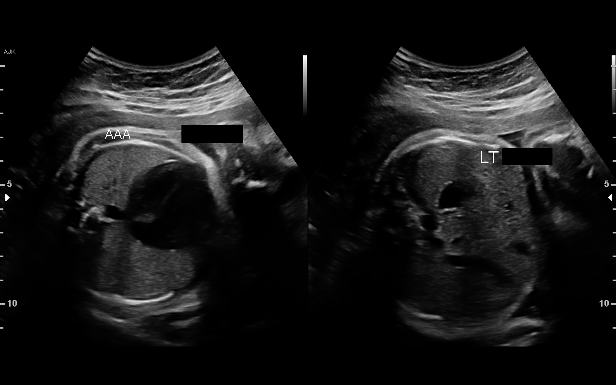
[im 56/137]
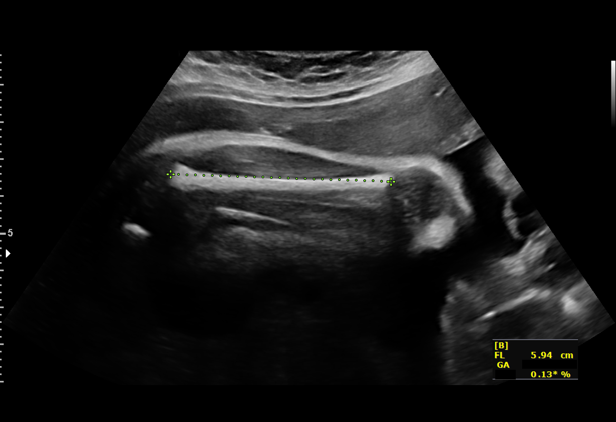
[im 71/137]
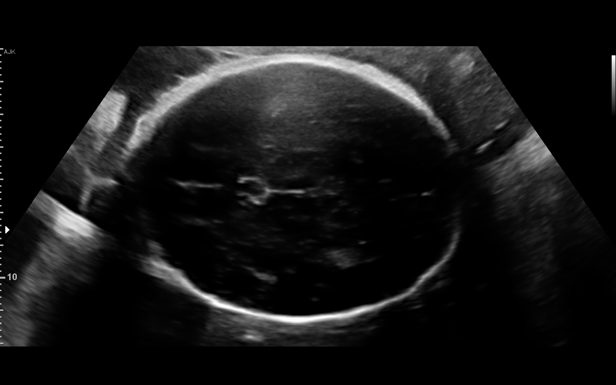
[im 81/137]
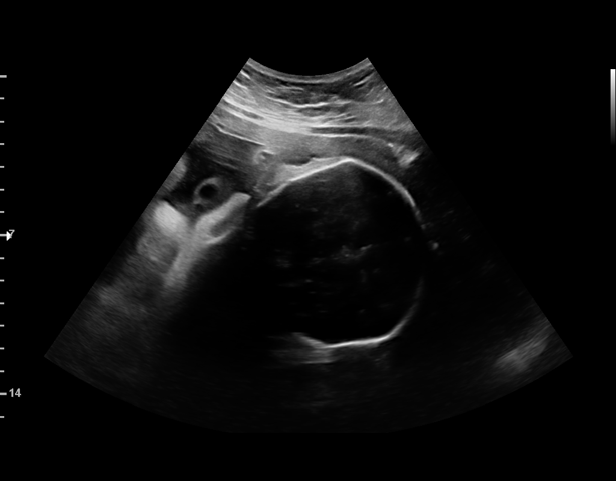
[im 96/137]
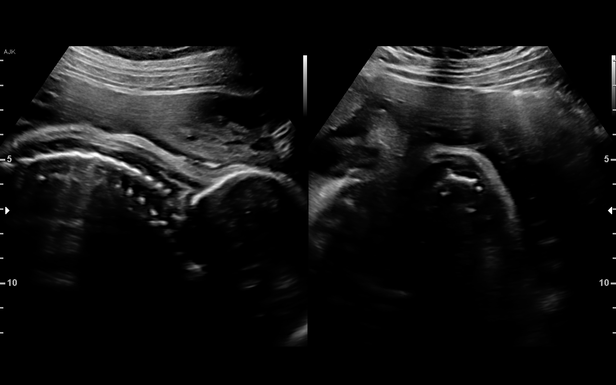
[im 106/137]
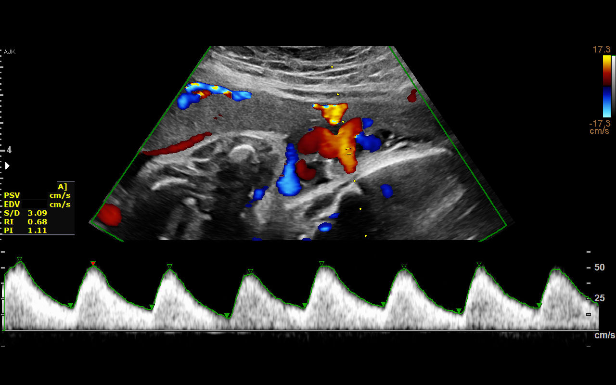
[im 121/137]
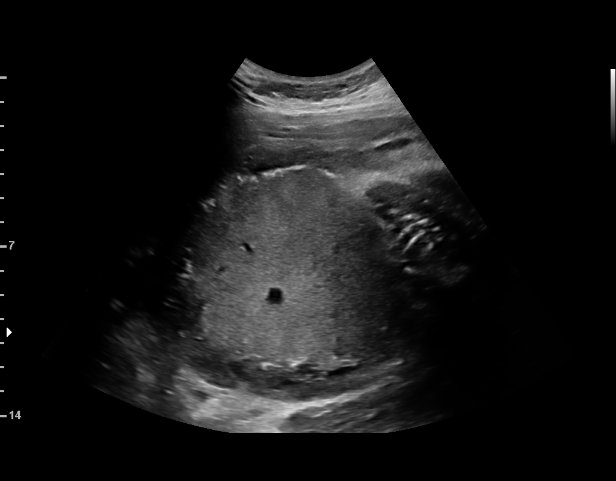
[im 131/137]
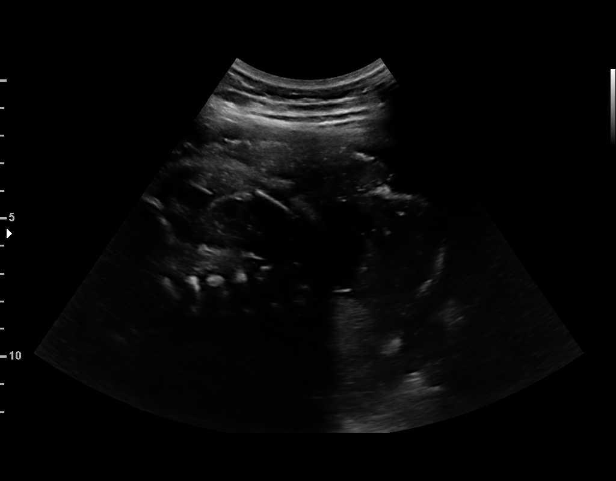

[11 of 28 positions shown; findings below may reference images not displayed]

[REDACTED]
                                                            Ave., [HOSPITAL]

  2  US MFM OB DETAIL ADDL GEST           76811.02     AUNTYJATTY DELOW[facility]
     +14 WK
     W/NONSTRESS
     W/NONSTRESS ADD'L GEST
  5  US MFM UA ADDL GEST                  76820.01     [facility]UNTYJATTY [facility]OW[facility]
  6  US MFM UA CORD DOPPLER               76820.02     AUNTYJATTY DELOWR
 ----------------------------------------------------------------------

 ----------------------------------------------------------------------
Indications

  Twin pregnancy, di/di, third trimester
  Abnormal fetal ultrasound (baby b:2vc)
  History of cesarean delivery, currently
  pregnant
  Gestational diabetes in pregnancy, diet
  controlled
  Maternal care for known or suspected poor
  fetal growth, third trimester, fetus 2
  Maternal care for known or suspected poor
  fetal growth, third trimester, fetus 1
  34 weeks gestation of pregnancy
  Encounter for antenatal screening for
  malformations
 ----------------------------------------------------------------------
Vital Signs

 BMI:
Fetal Evaluation (Fetus A)

 Num Of Fetuses:         2
 Fetal Heart Rate(bpm):  140
 Cardiac Activity:       Observed
 Presentation:           Cephalic
 Placenta:               Posterior

 Amniotic Fluid
 AFI FV:      Within normal limits

                             Largest Pocket(cm)

Biophysical Evaluation (Fetus A)

 Amniotic F.V:   Within normal limits       F. Tone:        Observed
 F. Movement:    Observed                   N.S.T:          Reactive
 F. Breathing:   Observed                   Score:          [DATE]
Biometry (Fetus A)

 BPD:      88.3  mm     G. Age:  35w 5d         75  %    CI:        83.43   %    70 - 86
                                                         FL/HC:      19.8   %    20.1 -
 HC:      304.8  mm     G. Age:  33w 6d          6  %    HC/AC:      1.15        0.93 -
 AC:      265.9  mm     G. Age:  30w 5d        < 3  %    FL/BPD:     68.3   %    71 - 87
 FL:       60.3  mm     G. Age:  31w 2d        < 3  %    FL/AC:      22.7   %    20 - 24

 Est. FW:    8278  gm           4 lb   < 10  %     FW Discordancy         5  %
OB History

 Gravidity:    3         Prem:   1
 Ectopic:      1        Living:  1
Gestational Age (Fetus A)

 LMP:           35w 1d        Date:  03/08/18                 EDD:   12/13/18
 U/S Today:     32w 6d                                        EDD:   12/29/18
 Best:          34w 6d     Det. By:  Previous Ultrasound      EDD:   12/15/18
                                     (08/17/18)
Anatomy (Fetus A)

 Cranium:               Appears normal         Aortic Arch:            Appears normal
 Cavum:                 Not well visualized    Ductal Arch:            Not well visualized
 Ventricles:            Not well visualized    Diaphragm:              Appears normal
 Choroid Plexus:        Not well visualized    Stomach:                Appears normal, left
                                                                       sided
 Cerebellum:            Not well visualized    Abdomen:                Appears normal
 Posterior Fossa:       Not well visualized    Abdominal Wall:         Appears nml (cord
                                                                       insert, abd wall)
 Nuchal Fold:           Not applicable (>20    Cord Vessels:           Appears normal (3
                        wks GA)                                        vessel cord)
 Face:                  Orbits appear          Kidneys:                Appear normal
                        normal
 Lips:                  Appears normal         Bladder:                Appears normal
 Thoracic:              Appears normal         Spine:                  Appears normal
 Heart:                 Appears normal         Upper Extremities:      LT nml; Rt nws
                        (4CH, axis, and
                        situs)
 RVOT:                  Appears normal         Lower Extremities:      LT nml; Rt nws
 LVOT:                  Appears normal

 Other:  Fetus appears to be a male. Technically difficult due to advanced GA
         and fetal position.
Doppler - Fetal Vessels (Fetus A)

 Umbilical Artery
  S/D     %tile     RI              PI
 3.04       77

Fetal Evaluation (Fetus B)

 Num Of Fetuses:         2
 Fetal Heart Rate(bpm):  130
 Cardiac Activity:       Observed
 Presentation:           Cephalic
 Placenta:               Anterior

 Amniotic Fluid
 AFI FV:      Within normal limits

                             Largest Pocket(cm)

Biophysical Evaluation (Fetus B)

 Amniotic F.V:   Within normal limits       F. Tone:        Observed
 F. Movement:    Observed                   N.S.T:          Reactive
 F. Breathing:   Observed                   Score:          [DATE]
Biometry (Fetus B)

 BPD:      79.3  mm     G. Age:  31w 6d          1  %    CI:        72.34   %    70 - 86
                                                         FL/HC:      20.2   %    20.1 -
 HC:      296.6  mm     G. Age:  32w 6d        < 3  %    HC/AC:      1.04        0.93 -
 AC:      285.4  mm     G. Age:  32w 4d          6  %    FL/BPD:     75.5   %    71 - 87
 FL:       59.9  mm     G. Age:  31w 1d        < 3  %    FL/AC:      21.0   %    20 - 24
 HUM:      52.5  mm     G. Age:  30w 4d        < 5  %
 LV:        5.1  mm

 Est. FW:    9855  gm      4 lb 3 oz     14  %     FW Discordancy      0 \ 5 %
Gestational Age (Fetus B)

 LMP:           35w 1d        Date:  03/08/18                 EDD:   12/13/18
 U/S Today:     32w 1d                                        EDD:   01/03/19
 Best:          34w 6d     Det. By:  Previous Ultrasound      EDD:   12/15/18
                                     (08/17/18)
Anatomy (Fetus B)

 Cranium:               Appears normal         Aortic Arch:            Not well visualized
 Cavum:                 Appears normal         Ductal Arch:            Not well visualized
 Ventricles:            Appears normal         Diaphragm:              Appears normal
 Choroid Plexus:        Appears normal         Stomach:                Appears normal, left
                                                                       sided
 Cerebellum:            Not well visualized    Abdomen:                Appears normal
 Posterior Fossa:       Not well visualized    Abdominal Wall:         Not well visualized
 Nuchal Fold:           Not applicable (>20    Cord Vessels:           2 Vessel Cord
                        wks GA)
 Face:                  Orbits appear          Kidneys:                Appear normal
                        normal
 Lips:                  Appears normal         Bladder:                Appears normal
 Heart:                 Not well visualized    Spine:                  Ltd views no
                                                                       intracranial signs of
                                                                       NT
 RVOT:                  Appears normal         Upper Extremities:      RT nml; Lt nws
 LVOT:                  Appears normal         Lower Extremities:      Appears normal

 Other:  Fetus appears to be female. Technically difficult due to advanced GA
         and fetal position.
Doppler - Fetal Vessels (Fetus B)

 Umbilical Artery
  S/D     %tile     RI              PI
 2.96       73

Impression

 On ultrasound, a dichorionic-diamniotic twin pregnancy is
 seen.

 Twin A: Maternal right, cephalic, posterior placenta, male
 fetus. Amniotic fluid is normal and good fetal activity is seen.
 The estimated fetal weight is 1,801 grams (4 lbs) or at less
 than the 10th percentile. Fetal anatomical survey is limited
 because of advanced gestational age. Antenatal testing is
 reassuring. Umbilical artery Doppler showed normal diastolic
 flow. NST is reactive. BPP [DATE].

 Twin B: Maternal left, cephalic, anterior placenta, female
 fetus. Amniotic fluid is normal and good fetal activity is seen.
 The estimated fetal weight is 1,905 grams (4 lbs) or at the
 14th percentile. Fetal anatomical survey is limited because of
 advanced gestational age. Single umbilical artery is seen.
 Antenatal testing is reassuring. Umbilical artery Doppler
 showed normal diastolic flow. NST is reactive. BPP [DATE].

 Growth discordancy: 5% (normal).
 xxxxxxxxxxxxxxxxxxxxxxxxxxxxxxxxxx
 Consultation Note (copied from [REDACTED])
 Maternal-Fetal Medicine

 Name: Zapata Varshney
 MRN: [id number]
 Requesting Provider: Enriquez Quispe, Isaac Fernando

 Ms. Aujla, [id number] at 35-weeks' gestation with
 dichorionic-diamniotic twin pregnancy, is here for ultrasound
 and consultation because of suspicion of fetal growth
 restriction in twins.

 Patient conceived after IVF and embryo transfers (day 5).
 She has been having ultrasound at your office. On cell-free
 fetal DNA screening, the risks of fetal aneuploidies are not
 increased.
 Patient has gestational diabetes that is well-controlled on diet.

 PMH: No history of hypertension or any other chronic medical
 conditions.
 PSH: Cesarean section.
 Medications: Prenatal vitamins.
 Allergies: NKDA.
 Social: Denies tobacco or drug or alcohol use. She has been
 married 8 years and her husband is in good health.
 Gyn: No history of abnormal Pap smears. Husband has
 oligospermia (reason for IVF).
 Obstetric history is significant for a term cesarean delivery in
 9882 ([REDACTED]) of a female infant weighing 5 lbs at birth.
 Patient reports she had low amniotic fluid and non-reassuring
 fetal heart trace during labor.
 In addition, she had one early spontaneous miscarriage.

 I counseled the patient on the following:

 Dichorionic-diamniotic twin pregnancy with isolated fetal
 growth restriction:
 I explained ultrasound findings. Ultrasound has limitations in
 accurately-estimating fetal weighs. Although her previous
 child weighed 5 lbs at birth, she had oligohydramnios and it is
 possible that there was fetal growth restriction. I also
 informed her that smaller babies do not reflect growth
 restriction (can be constitutionally small).
 Timing of delivery: Given the possibility of isolated fetal
 growth restriction, delivery is recommended between 36 0/7
 to 37 [DATE] weeks (ACOG Committee Opinion No. [DATE]). It is reasonable to consider delivery at 37 weeks. I
 counseled the couple to consider delivery at 37 weeks.

 I did not counsel on gestational diabetes.

 Previous cesarean delivery:
 We also discussed VBAC. I explained the risk of scar
 dehiscence (2% to 3% with oxytocin). If cervix is favorable,
 she can attempt VBAC. Alternatively, she can opt for elective
 cesarean delivery. Patient is keen on having more children. I
 informed her that repeat cesarean deliveries increase the
 likelihood of having placenta previa or accreta in subsequent
 pregnancies.

 Discussed with Dr. Goudeau. Patient has an appointment with her
 today.

 Consultation including face-to-face counseling: 40 min.
Recommendations

 -Antenatal testing including Doppler studies next week.
 -Delivery at 37 weeks.

                 Reder, Czesiek

## 2020-06-22 IMAGING — US US OB TRANSVAGINAL
1 series · 13 of 28 positions shown · non-contrast
Comparison: None.

CLINICAL DATA: 31-year-old pregnant female presenting with bloody
vaginal discharge.

EXAM:
TWIN OBSTETRIC <14WK US AND TRANSVAGINAL OB US

[Series 1: us ob transvaginal · 0.25mm/px · 13 of 57 slices shown]
[im 3/57]
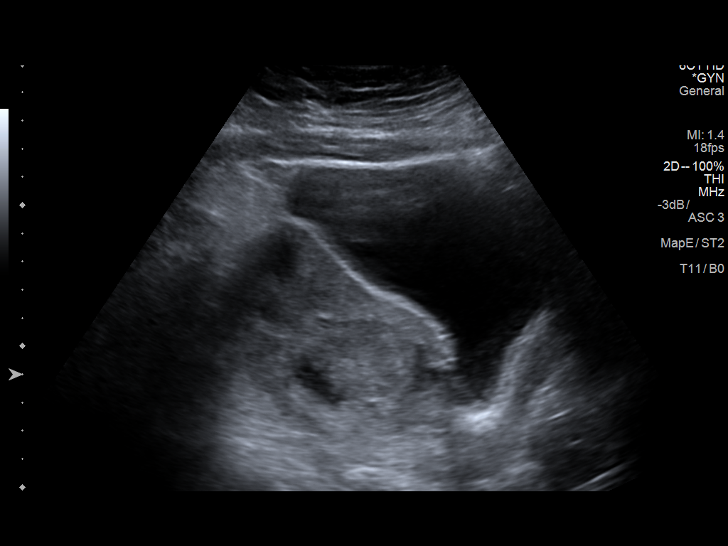
[im 7/57]
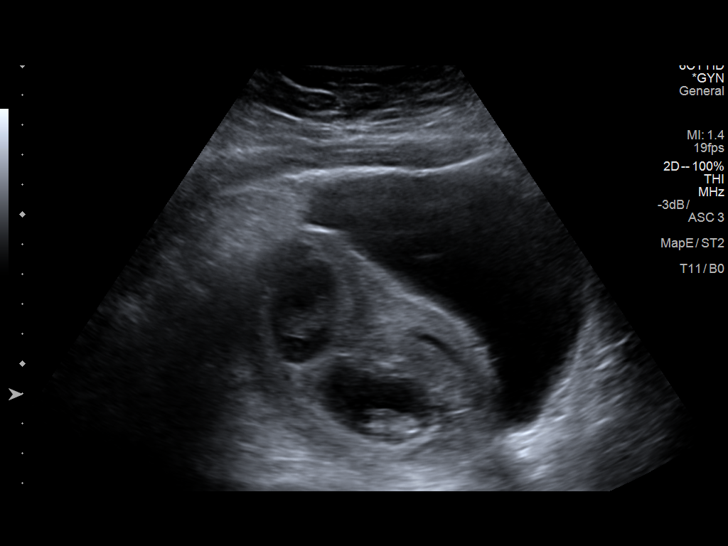
[im 11/57]
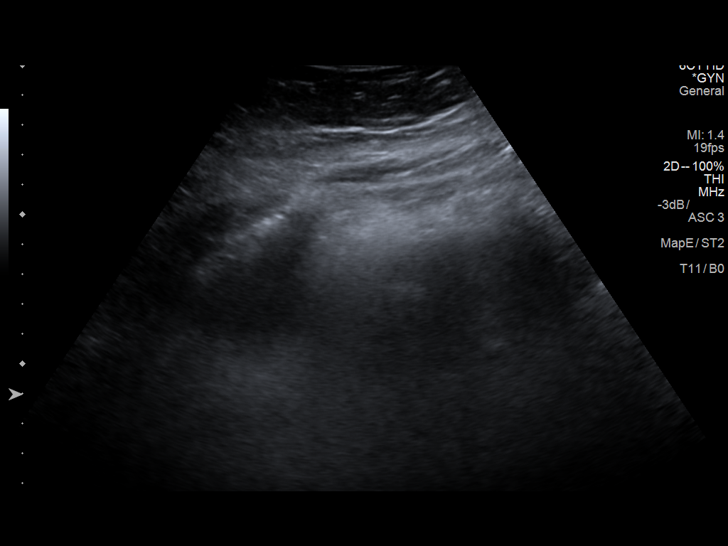
[im 15/57]
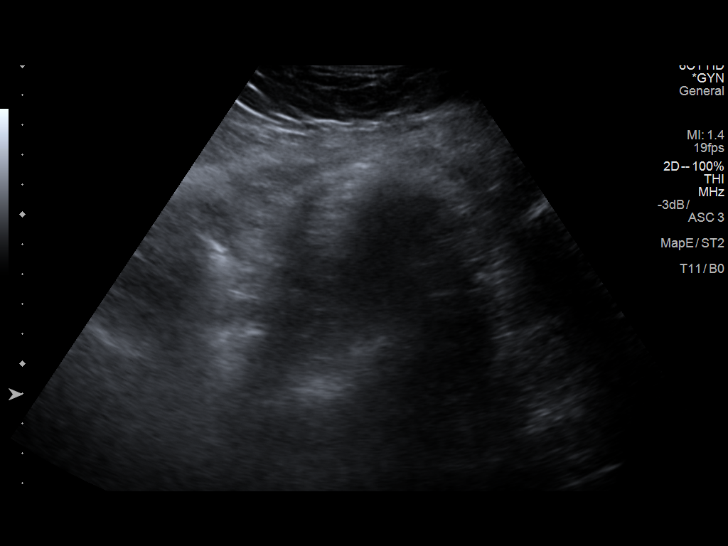
[im 19/57]
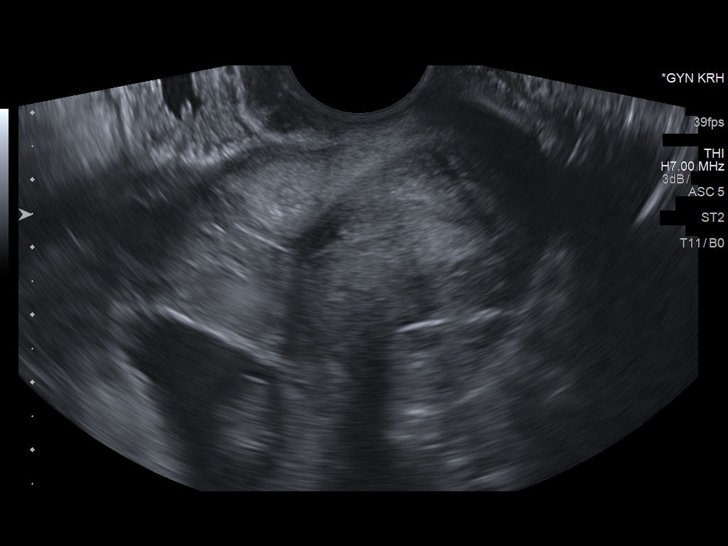
[im 23/57]
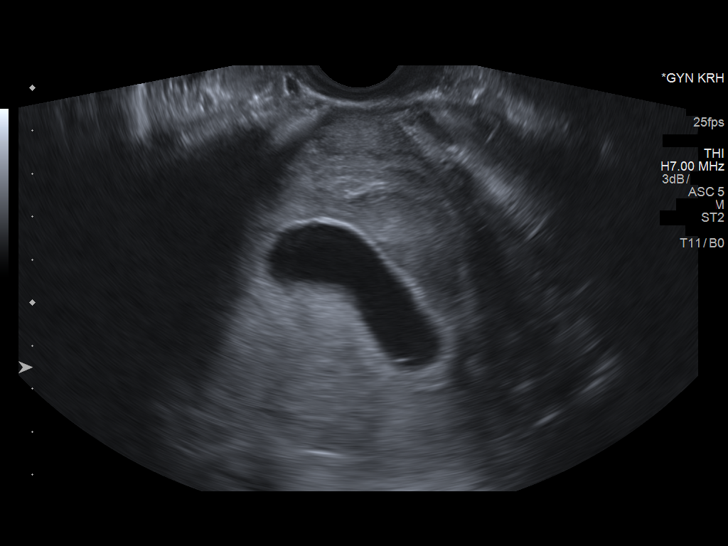
[im 30/57]
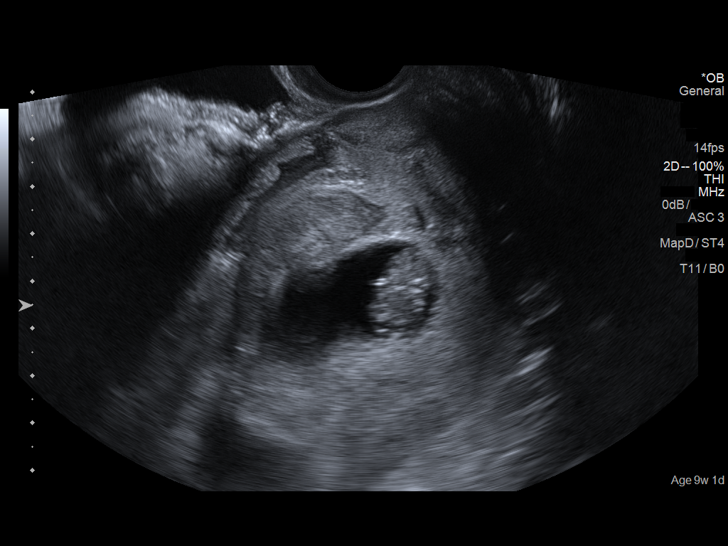
[im 34/57]
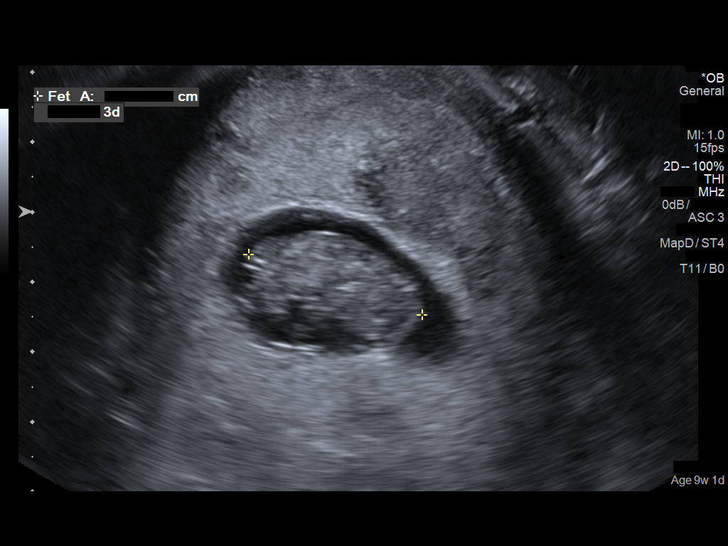
[im 38/57]
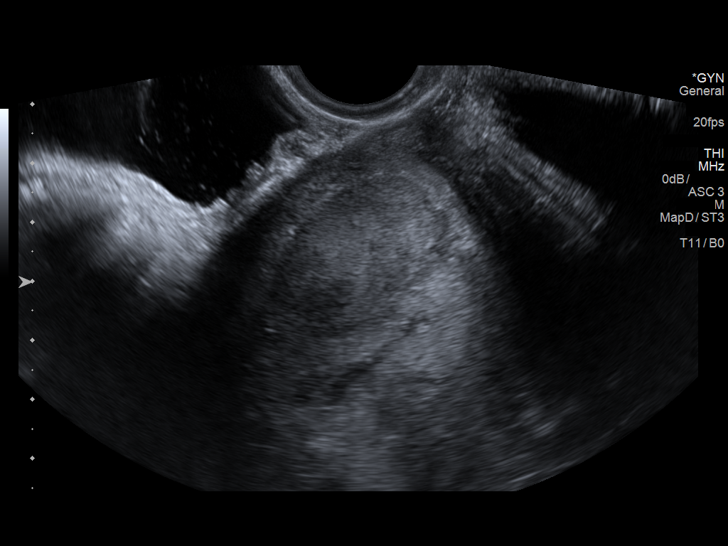
[im 42/57]
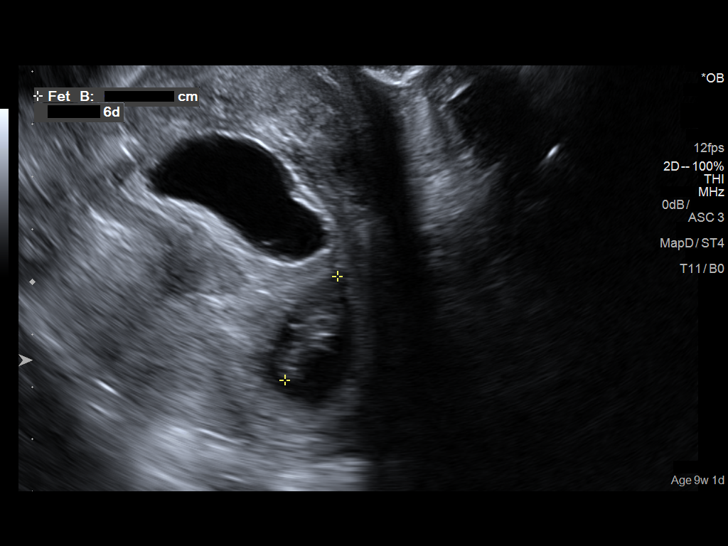
[im 46/57]
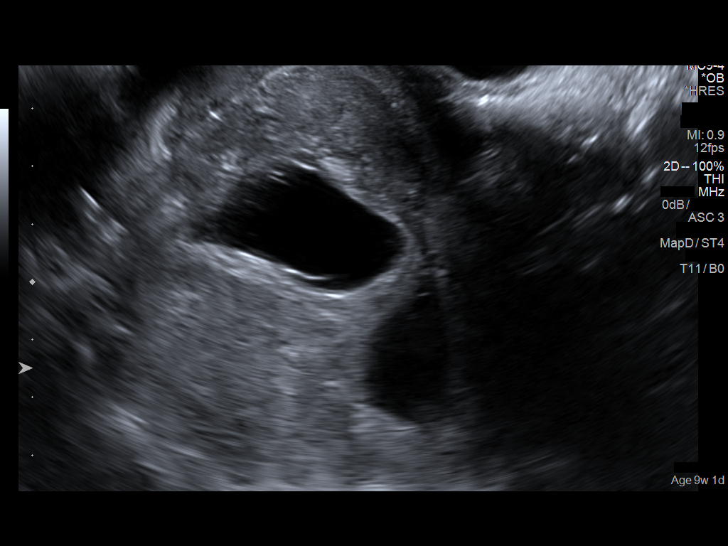
[im 50/57]
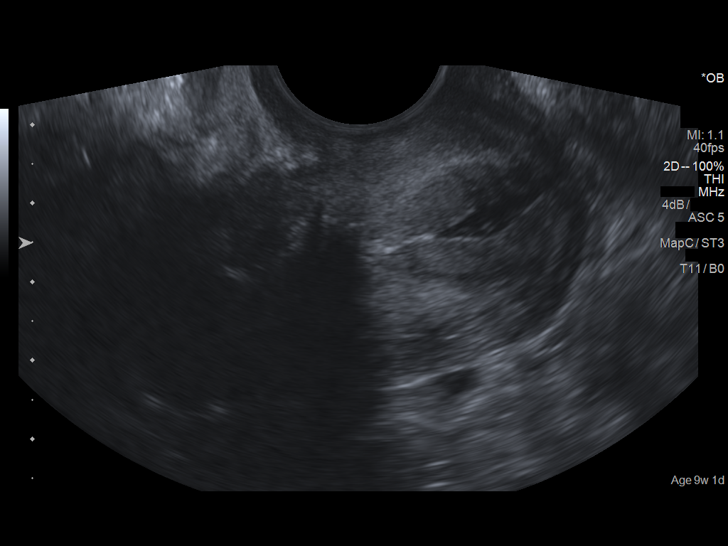
[im 54/57]
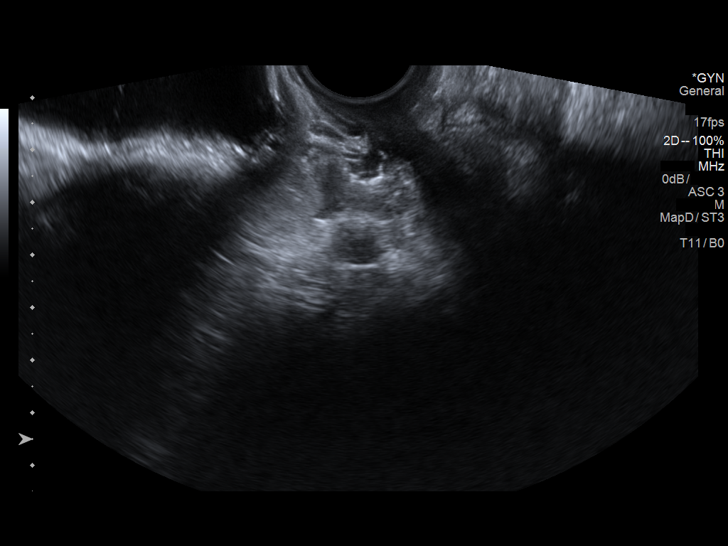

[13 of 28 positions shown; findings below may reference images not displayed]

FINDINGS: Number of IUPs:  2

Chorionicity/Amnionicity:  Dichorionic diamniotic.

TWIN 1

Yolk sac:  Seen

Embryo:  Present

Cardiac Activity: Detected

Heart Rate: 175 bpm

CRL:  26 mm   9 w 3 d                  US EDC: 12/13/2018

TWIN 2

Yolk sac:  Seen

Embryo:  Present

Cardiac Activity: Detected

Heart Rate: 171 bpm

CRL:  23 mm   8 w 6 d                  US EDC: 12/17/2018

Subchorionic hemorrhage: There is a 3.2 x 0.6 cm subchorionic
hemorrhage in the fundal uterus with suboptimal evaluation for
proximity to the gestational sacs.

Maternal uterus/adnexae: The uterus is heterogeneous with hypoechoic
areas which may represent fibroids. A 2.7 x 3.0 cm heterogeneous
hypoechoic area in the fundal uterus close to the second gestational
sac may represent a fibroid and less likely a subchorionic
hemorrhage. In addition the gestational sacs appear to be surrounded
by a thick layer of endometrium and therefore the possibility of a
septate or bicornuate uterine morphology is not entirely excluded.
Further evaluation with repeat ultrasound on a non
emergent/outpatient basis is recommended. The ovaries are not
visualized.
IMPRESSION: 1. Dichorionic diamniotic live twin pregnancy as described.
2. Heterogeneous uterus with probable fibroids and a moderate size
subchorionic hemorrhage. Follow-up recommended.
3. Suboptimal evaluation of the uterus on this exam. The possibility
of a bicornuate or septate morphology is not entirely excluded.
Further evaluation with repeat ultrasound on a nonemergent basis
recommended.
